# Patient Record
Sex: Female | Born: 1935 | Race: White | Hispanic: No | Smoking: Never smoker
Health system: Southern US, Community
[De-identification: ages and names within clinical notes are randomized; demographics above are authoritative.]

## PROBLEM LIST (undated history)

## (undated) DIAGNOSIS — I10 Essential (primary) hypertension: Secondary | ICD-10-CM

## (undated) HISTORY — PX: ABDOMINAL HYSTERECTOMY: SHX81

---

## 2005-06-10 ENCOUNTER — Ambulatory Visit: Payer: Self-pay

## 2006-09-01 ENCOUNTER — Ambulatory Visit: Payer: Self-pay

## 2007-02-12 ENCOUNTER — Emergency Department: Payer: Self-pay | Admitting: Emergency Medicine

## 2007-02-26 ENCOUNTER — Other Ambulatory Visit: Payer: Self-pay

## 2007-02-27 ENCOUNTER — Observation Stay: Payer: Self-pay | Admitting: Internal Medicine

## 2007-02-28 ENCOUNTER — Inpatient Hospital Stay: Payer: Self-pay | Admitting: Internal Medicine

## 2007-02-28 ENCOUNTER — Other Ambulatory Visit: Payer: Self-pay

## 2007-03-06 ENCOUNTER — Ambulatory Visit: Payer: Self-pay | Admitting: Internal Medicine

## 2007-04-13 ENCOUNTER — Ambulatory Visit: Payer: Self-pay | Admitting: Gastroenterology

## 2007-05-01 ENCOUNTER — Ambulatory Visit: Payer: Self-pay

## 2007-09-06 ENCOUNTER — Ambulatory Visit: Payer: Self-pay | Admitting: Internal Medicine

## 2007-12-29 ENCOUNTER — Observation Stay: Payer: Self-pay | Admitting: Internal Medicine

## 2007-12-29 ENCOUNTER — Other Ambulatory Visit: Payer: Self-pay

## 2008-01-06 ENCOUNTER — Ambulatory Visit: Payer: Self-pay | Admitting: Internal Medicine

## 2008-01-26 ENCOUNTER — Ambulatory Visit: Payer: Self-pay | Admitting: General Surgery

## 2008-09-20 ENCOUNTER — Emergency Department: Payer: Self-pay | Admitting: Emergency Medicine

## 2008-10-31 ENCOUNTER — Ambulatory Visit: Payer: Self-pay | Admitting: Internal Medicine

## 2009-03-18 ENCOUNTER — Emergency Department: Payer: Self-pay | Admitting: Emergency Medicine

## 2009-11-01 ENCOUNTER — Ambulatory Visit: Payer: Self-pay | Admitting: Internal Medicine

## 2010-03-03 ENCOUNTER — Observation Stay: Payer: Self-pay | Admitting: Internal Medicine

## 2010-08-26 ENCOUNTER — Emergency Department: Payer: Self-pay | Admitting: Emergency Medicine

## 2010-11-04 ENCOUNTER — Ambulatory Visit: Payer: Self-pay | Admitting: Internal Medicine

## 2010-11-15 ENCOUNTER — Observation Stay: Payer: Self-pay | Admitting: Internal Medicine

## 2010-12-07 ENCOUNTER — Inpatient Hospital Stay: Payer: Self-pay | Admitting: Internal Medicine

## 2011-11-13 IMAGING — CR DG CHEST 1V
1 series · 1 of 1 positions shown · non-contrast
Comparison: none

REASON FOR EXAM: SOB HTN
COMMENTS:   May transport without cardiac monitor

[view not recorded]
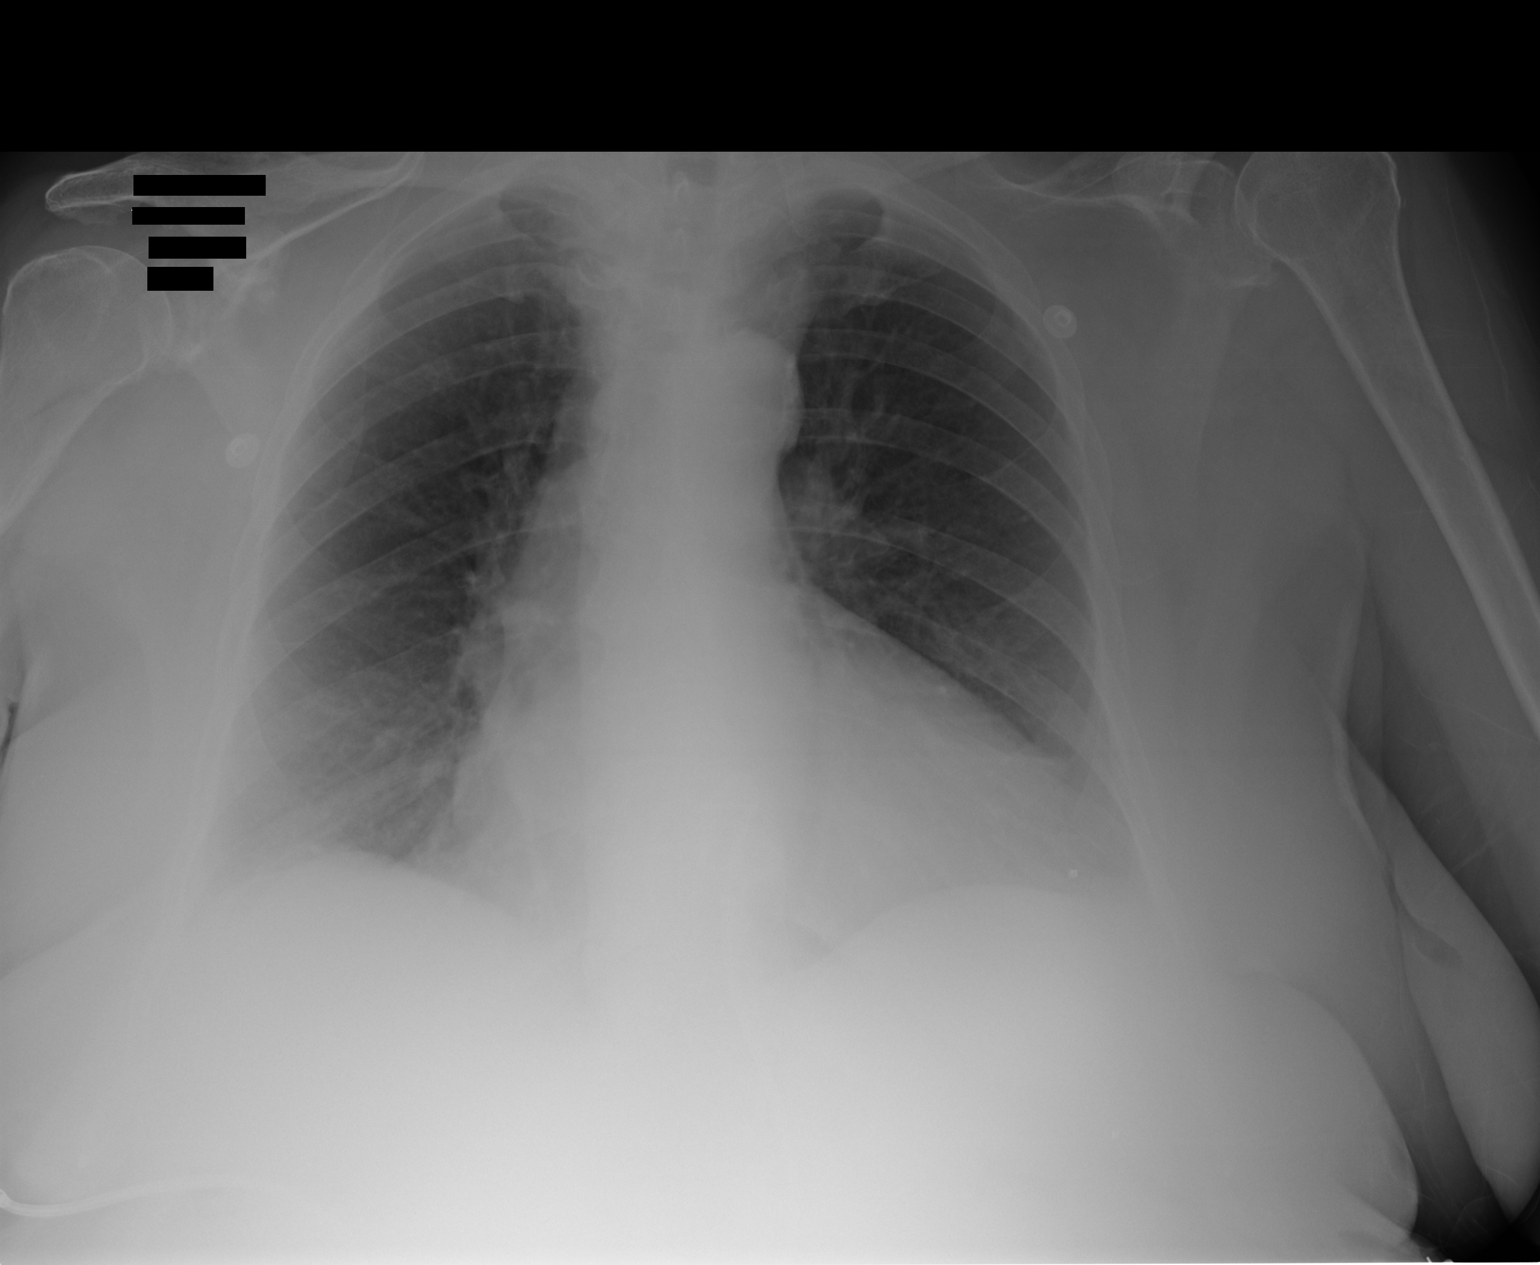

[1 of 1 positions shown; findings below may reference images not displayed]

PROCEDURE:     DXR - DXR CHEST 1 VIEWAP OR PA  - December 07, 2010 [DATE]

RESULT:     Comparison is made to the study 15 November, 2010.

The lungs are adequately inflated. There is no focal infiltrate. The cardiac
silhouette is enlarged. The pulmonary vascularity is not engorged. There is
no pleural effusion.
IMPRESSION: I do not see definite evidence of acute cardiopulmonary
abnormality. Chronic enlargement of the cardiac silhouette with mild
prominence of the pulmonary vasculature is noted.

## 2011-12-08 ENCOUNTER — Ambulatory Visit: Payer: Self-pay | Admitting: Internal Medicine

## 2012-11-03 ENCOUNTER — Ambulatory Visit: Payer: Self-pay | Admitting: Gastroenterology

## 2013-12-24 ENCOUNTER — Emergency Department: Payer: Self-pay | Admitting: Emergency Medicine

## 2013-12-24 LAB — URINALYSIS, COMPLETE
BACTERIA: NONE SEEN
Bilirubin,UR: NEGATIVE
Glucose,UR: NEGATIVE mg/dL (ref 0–75)
Ketone: NEGATIVE
Nitrite: NEGATIVE
PH: 6 (ref 4.5–8.0)
Protein: NEGATIVE
RBC,UR: <1 /HPF (ref 0–5)
Specific Gravity: 1.005 (ref 1.003–1.030)
Squamous Epithelial: 1
WBC UR: 7 /HPF (ref 0–5)

## 2014-03-24 ENCOUNTER — Emergency Department: Payer: Self-pay | Admitting: Emergency Medicine

## 2014-03-24 LAB — CBC WITH DIFFERENTIAL/PLATELET
BASOS PCT: 1.2 %
Basophil #: 0.1 10*3/uL (ref 0.0–0.1)
Eosinophil #: 0.2 10*3/uL (ref 0.0–0.7)
Eosinophil %: 2 %
HCT: 45.5 % (ref 35.0–47.0)
HGB: 15.2 g/dL (ref 12.0–16.0)
LYMPHS PCT: 15 %
Lymphocyte #: 1.3 10*3/uL (ref 1.0–3.6)
MCH: 31.1 pg (ref 26.0–34.0)
MCHC: 33.3 g/dL (ref 32.0–36.0)
MCV: 93 fL (ref 80–100)
MONOS PCT: 10.7 %
Monocyte #: 0.9 x10 3/mm (ref 0.2–0.9)
NEUTROS ABS: 6.2 10*3/uL (ref 1.4–6.5)
Neutrophil %: 71.1 %
Platelet: 225 10*3/uL (ref 150–440)
RBC: 4.88 10*6/uL (ref 3.80–5.20)
RDW: 13.7 % (ref 11.5–14.5)
WBC: 8.7 10*3/uL (ref 3.6–11.0)

## 2014-03-24 LAB — COMPREHENSIVE METABOLIC PANEL
ALBUMIN: 3.3 g/dL — AB (ref 3.4–5.0)
ALK PHOS: 68 U/L
ANION GAP: 7 (ref 7–16)
BUN: 14 mg/dL (ref 7–18)
Bilirubin,Total: 0.5 mg/dL (ref 0.2–1.0)
CREATININE: 1.2 mg/dL (ref 0.60–1.30)
Calcium, Total: 8.9 mg/dL (ref 8.5–10.1)
Chloride: 105 mmol/L (ref 98–107)
Co2: 30 mmol/L (ref 21–32)
EGFR (African American): 49 — ABNORMAL LOW
EGFR (Non-African Amer.): 42 — ABNORMAL LOW
GLUCOSE: 113 mg/dL — AB (ref 65–99)
OSMOLALITY: 284 (ref 275–301)
Potassium: 3.8 mmol/L (ref 3.5–5.1)
SGOT(AST): 16 U/L (ref 15–37)
SGPT (ALT): 19 U/L
Sodium: 142 mmol/L (ref 136–145)
Total Protein: 6.8 g/dL (ref 6.4–8.2)

## 2014-03-24 LAB — URINALYSIS, COMPLETE
BLOOD: NEGATIVE
Bacteria: NONE SEEN
Bilirubin,UR: NEGATIVE
Glucose,UR: NEGATIVE mg/dL (ref 0–75)
Hyaline Cast: 2
Ketone: NEGATIVE
Nitrite: NEGATIVE
Ph: 6 (ref 4.5–8.0)
Protein: NEGATIVE
RBC,UR: <1 /HPF (ref 0–5)
Specific Gravity: 1.008 (ref 1.003–1.030)
Squamous Epithelial: 1
WBC UR: 1 /HPF (ref 0–5)

## 2014-04-03 DIAGNOSIS — N183 Chronic kidney disease, stage 3 unspecified: Secondary | ICD-10-CM | POA: Diagnosis present

## 2014-04-03 DIAGNOSIS — G4733 Obstructive sleep apnea (adult) (pediatric): Secondary | ICD-10-CM | POA: Diagnosis present

## 2017-01-24 ENCOUNTER — Observation Stay
Admission: EM | Admit: 2017-01-24 | Discharge: 2017-01-25 | Disposition: A | Discharge status: Home / Self Care | Payer: Medicare Other | Attending: Internal Medicine | Admitting: Internal Medicine

## 2017-01-24 ENCOUNTER — Encounter: Payer: Self-pay | Admitting: Emergency Medicine

## 2017-01-24 ENCOUNTER — Emergency Department: Payer: Medicare Other

## 2017-01-24 DIAGNOSIS — Z7982 Long term (current) use of aspirin: Secondary | ICD-10-CM | POA: Diagnosis not present

## 2017-01-24 DIAGNOSIS — Z9889 Other specified postprocedural states: Secondary | ICD-10-CM | POA: Diagnosis not present

## 2017-01-24 DIAGNOSIS — Z6829 Body mass index (BMI) 29.0-29.9, adult: Secondary | ICD-10-CM | POA: Diagnosis not present

## 2017-01-24 DIAGNOSIS — G4733 Obstructive sleep apnea (adult) (pediatric): Secondary | ICD-10-CM | POA: Insufficient documentation

## 2017-01-24 DIAGNOSIS — R51 Headache: Principal | ICD-10-CM | POA: Insufficient documentation

## 2017-01-24 DIAGNOSIS — Z8249 Family history of ischemic heart disease and other diseases of the circulatory system: Secondary | ICD-10-CM | POA: Insufficient documentation

## 2017-01-24 DIAGNOSIS — I10 Essential (primary) hypertension: Secondary | ICD-10-CM | POA: Diagnosis not present

## 2017-01-24 DIAGNOSIS — Z79899 Other long term (current) drug therapy: Secondary | ICD-10-CM | POA: Insufficient documentation

## 2017-01-24 DIAGNOSIS — E669 Obesity, unspecified: Secondary | ICD-10-CM | POA: Diagnosis not present

## 2017-01-24 DIAGNOSIS — Z888 Allergy status to other drugs, medicaments and biological substances status: Secondary | ICD-10-CM | POA: Insufficient documentation

## 2017-01-24 DIAGNOSIS — Z90721 Acquired absence of ovaries, unilateral: Secondary | ICD-10-CM | POA: Insufficient documentation

## 2017-01-24 DIAGNOSIS — Z8042 Family history of malignant neoplasm of prostate: Secondary | ICD-10-CM | POA: Diagnosis not present

## 2017-01-24 DIAGNOSIS — R519 Headache, unspecified: Secondary | ICD-10-CM | POA: Diagnosis present

## 2017-01-24 DIAGNOSIS — E785 Hyperlipidemia, unspecified: Secondary | ICD-10-CM | POA: Insufficient documentation

## 2017-01-24 DIAGNOSIS — Z9049 Acquired absence of other specified parts of digestive tract: Secondary | ICD-10-CM | POA: Diagnosis not present

## 2017-01-24 DIAGNOSIS — Z806 Family history of leukemia: Secondary | ICD-10-CM | POA: Diagnosis not present

## 2017-01-24 DIAGNOSIS — Z90710 Acquired absence of both cervix and uterus: Secondary | ICD-10-CM | POA: Diagnosis not present

## 2017-01-24 HISTORY — DX: Essential (primary) hypertension: I10

## 2017-01-24 LAB — COMPREHENSIVE METABOLIC PANEL
ALK PHOS: 68 U/L (ref 38–126)
ALT: 15 U/L (ref 14–54)
AST: 22 U/L (ref 15–41)
Albumin: 3.8 g/dL (ref 3.5–5.0)
Anion gap: 7 (ref 5–15)
BUN: 16 mg/dL (ref 6–20)
CALCIUM: 9.1 mg/dL (ref 8.9–10.3)
CHLORIDE: 104 mmol/L (ref 101–111)
CO2: 30 mmol/L (ref 22–32)
CREATININE: 0.86 mg/dL (ref 0.44–1.00)
GFR calc Af Amer: >60 mL/min (ref >60)
Glucose, Bld: 110 mg/dL — ABNORMAL HIGH (ref 65–99)
Potassium: 3.8 mmol/L (ref 3.5–5.1)
Sodium: 141 mmol/L (ref 135–145)
Total Bilirubin: 0.6 mg/dL (ref 0.3–1.2)
Total Protein: 6.8 g/dL (ref 6.5–8.1)

## 2017-01-24 LAB — CBC
HEMATOCRIT: 42.2 % (ref 35.0–47.0)
HEMOGLOBIN: 14.6 g/dL (ref 12.0–16.0)
MCH: 31.7 pg (ref 26.0–34.0)
MCHC: 34.6 g/dL (ref 32.0–36.0)
MCV: 91.7 fL (ref 80.0–100.0)
PLATELETS: 250 10*3/uL (ref 150–440)
RBC: 4.6 MIL/uL (ref 3.80–5.20)
RDW: 13.4 % (ref 11.5–14.5)
WBC: 10.6 10*3/uL (ref 3.6–11.0)

## 2017-01-24 LAB — CSF CELL COUNT WITH DIFFERENTIAL
Eosinophils, CSF: 1 %
Eosinophils, CSF: 2 %
LYMPHS CSF: 10 %
LYMPHS CSF: 19 %
MONOCYTE-MACROPHAGE-SPINAL FLUID: 8 %
MONOCYTE-MACROPHAGE-SPINAL FLUID: 8 %
OTHER CELLS CSF: 0
OTHER CELLS CSF: 0
RBC COUNT CSF: 44042 /mm3 — AB (ref 0–3)
RBC COUNT CSF: 67241 /mm3 — AB (ref 0–3)
SEGMENTED NEUTROPHILS-CSF: 71 %
SEGMENTED NEUTROPHILS-CSF: 81 %
Tube #: 1
Tube #: 3
WBC, CSF: 129 /mm3 (ref 0–5)
WBC, CSF: 193 /mm3 (ref 0–5)

## 2017-01-24 LAB — URINALYSIS, COMPLETE (UACMP) WITH MICROSCOPIC
BILIRUBIN URINE: NEGATIVE
Bacteria, UA: NONE SEEN
GLUCOSE, UA: NEGATIVE mg/dL
Hgb urine dipstick: NEGATIVE
KETONES UR: NEGATIVE mg/dL
Leukocytes, UA: NEGATIVE
Nitrite: NEGATIVE
PROTEIN: NEGATIVE mg/dL
SQUAMOUS EPITHELIAL / LPF: NONE SEEN
Specific Gravity, Urine: 1.015 (ref 1.005–1.030)
pH: 6 (ref 5.0–8.0)

## 2017-01-24 LAB — PROTEIN AND GLUCOSE, CSF
GLUCOSE CSF: 70 mg/dL (ref 40–70)
TOTAL PROTEIN, CSF: 65 mg/dL — AB (ref 15–45)

## 2017-01-24 MED ORDER — DEXTROSE 5 % IV SOLN
2.0000 g | Freq: Once | INTRAVENOUS | Status: AC
  Administered 2017-01-24: 2 g via INTRAVENOUS
  Filled 2017-01-24: qty 2

## 2017-01-24 MED ORDER — DEXTROSE 5 % IV SOLN
INTRAVENOUS | Status: AC
  Filled 2017-01-24: qty 10

## 2017-01-24 NOTE — ED Notes (Signed)
md in to perform lumbar puncture with assist of juanetta, ed tech.

## 2017-01-24 NOTE — ED Notes (Signed)
md in to speak with family regarding lumbar puncture.

## 2017-01-24 NOTE — ED Triage Notes (Signed)
Pt report she woke up with a bad headache around 0800 states the headache comes and goes. Family with patient states about 30 minutes prior to arrival patient was complaining of headache that made her feel like she was going to pass out.  Pt reports intermittent vision problems this morning but states it is hard to explain what the problems were.  Pt denies any numbness or lightheadedness.  Grip strength equal, no facial droop no sensory changes.

## 2017-01-24 NOTE — ED Provider Notes (Signed)
[facility] Emergency Department Provider Note  ________________________________________   I have reviewed the triage vital signs and the nursing notes.   HISTORY  Chief Complaint Headache   History limited by: Not Limited   HPI Stacy Watts is a 83 y.o. female who presents to the emergency department today because of headache. It started this morning. She noticed it shortly after waking up. Tried taking some tylenol which did not significantly help the headache. The patient has not noticed any change in vision. States that she never has headaches. Daughter states that she visited the patient last week and she complained of feeling unwell and there has been some confusion. No fevers.   Past Medical History:  Diagnosis Date  . Hypertension     There are no active problems to display for this patient.   Past Surgical History:  Procedure Laterality Date  . ABDOMINAL HYSTERECTOMY      Prior to Admission medications   Medication Sig Start Date End Date Taking? Authorizing Provider  acetaminophen (TYLENOL) 500 MG tablet Take 500 mg by mouth every 6 (six) hours as needed.   Yes [provider]  aspirin EC 81 MG tablet Take 81 mg by mouth daily.   Yes [provider]  cholecalciferol (VITAMIN D) 1000 units tablet Take 1,000 Units by mouth daily.   Yes [provider]  hydrochlorothiazide (MICROZIDE) 12.5 MG capsule Take 12.5 mg by mouth daily.   Yes [provider]    Allergies Other  History reviewed. No pertinent family history.  Social History Social History  Substance Use Topics  . Smoking status: Never Smoker  . Smokeless tobacco: Never Used  . Alcohol use No    Review of Systems Constitutional: No fever/chills Eyes: No visual changes. ENT: No sore throat. Cardiovascular: Denies chest pain. Respiratory: Denies shortness of breath. Gastrointestinal: No abdominal pain.  No nausea, no vomiting.  No  diarrhea.   Genitourinary: Negative for dysuria. Musculoskeletal: Negative for back pain. Skin: Negative for rash. Neurological: Positive for headache.   ____________________________________________   PHYSICAL EXAM:  VITAL SIGNS: ED Triage Vitals  Enc Vitals Group     BP 01/24/17 1833 (!) 144/79     Pulse Rate 01/24/17 1833 78     Resp 01/24/17 1833 18     Temp 01/24/17 1833 98.3 F (36.8 C)     Temp Source 01/24/17 1833 Oral     SpO2 01/24/17 1833 94 %     Weight 01/24/17 1834 160 lb (72.6 kg)     Height 01/24/17 1834 4\' 10"  (1.473 m)    Constitutional: Alert and oriented. Well appearing and in no distress. Eyes: Conjunctivae are normal.  ENT   Head: Normocephalic and atraumatic.   Nose: No congestion/rhinnorhea.   Mouth/Throat: Mucous membranes are moist.   Neck: No stridor. No midline tenderness.  Hematological/Lymphatic/Immunilogical: No cervical lymphadenopathy. Cardiovascular: Normal rate, regular rhythm.  No murmurs, rubs, or gallops.  Respiratory: Normal respiratory effort without tachypnea nor retractions. Breath sounds are clear and equal bilaterally. No wheezes/rales/rhonchi. Gastrointestinal: Soft and non tender. No rebound. No guarding.  Genitourinary: Deferred Musculoskeletal: Normal range of motion in all extremities. No lower extremity edema. Neurologic:  Normal speech and language. No gross focal neurologic deficits are appreciated.  Skin:  Skin is warm, dry and intact. No rash noted. Psychiatric: Mood and affect are normal. Speech and behavior are normal. Patient exhibits appropriate insight and judgment.  ____________________________________________    LABS (pertinent positives/negatives)  Labs Reviewed  COMPREHENSIVE METABOLIC PANEL - Abnormal; Notable for the following:       Result Value   Glucose, Bld 110 (*)    All other components within normal limits  URINALYSIS, COMPLETE (UACMP) WITH MICROSCOPIC - Abnormal; Notable for the  following:    Color, Urine YELLOW (*)    APPearance CLEAR (*)    All other components within normal limits  CSF CELL COUNT WITH DIFFERENTIAL - Abnormal; Notable for the following:    Color, CSF RED (*)    Appearance, CSF CLOUDY (*)    RBC Count, CSF 44,042 (*)    WBC, CSF 129 (*)    All other components within normal limits  CSF CELL COUNT WITH DIFFERENTIAL - Abnormal; Notable for the following:    Color, CSF RED (*)    Appearance, CSF CLOUDY (*)    RBC Count, CSF 67,241 (*)    WBC, CSF 193 (*)    All other components within normal limits  PROTEIN AND GLUCOSE, CSF - Abnormal; Notable for the following:    Total  Protein, CSF 65 (*)    All other components within normal limits  BASIC METABOLIC PANEL - Abnormal; Notable for the following:    Glucose, Bld 114 (*)    Calcium 8.6 (*)    GFR calc non Af Amer 50 (*)    GFR calc Af Amer 58 (*)    All other components within normal limits  CSF CULTURE  CBC  CBC  SEDIMENTATION RATE  PATHOLOGIST SMEAR REVIEW  PATHOLOGIST SMEAR REVIEW  HERPES SIMPLEX VIRUS(HSV) DNA BY PCR     ____________________________________________   EKG  None  ____________________________________________    RADIOLOGY  CT head IMPRESSION: 1. No acute intracranial abnormalities. 2. Mild cerebral atrophy with mild chronic microvascular ischemic changes in the cerebral white matter, similar to the prior study.   ____________________________________________   PROCEDURES  Procedures  LUMBAR PUNCTURE  Date/Time: 01/24/2017 at 9:56 PM Performed by: Phineas Semen  Consent: Verbal consent obtained. Written consent obtained. Risks and benefits: risks, benefits and alternatives were discussed Consent given by: Patient and family Patient understanding: patient states understanding of the procedure being performed  Patient consent: the patient's understanding of the procedure matches consent given  Procedure consent: procedure consent matches  procedure scheduled  Relevant documents: relevant documents present and verified  Test results: test results available and properly labeled Site marked: the operative site was marked Imaging studies: imaging studies available  Required items: required blood products, implants, devices, and special equipment available  Patient identity confirmed: verbally with patient and arm band  Time out: Immediately prior to procedure a "time out" was called to verify the correct patient, procedure, equipment, support staff and site/side marked as required.  Indications: headache Anesthesia: local infiltration Local anesthetic: lidocaine 1% without epinephrine Anesthetic total: 2 ml Patient sedated: no Analgesia: none Preparation: Patient was prepped and draped in the usual sterile fashion. Lumbar space: L3-L4 interspace Patient's position: left lateral decubitus Needle gauge: 22 Needle length: 3.5 in Number of attempts: 1 Opening pressure: 14 cm H2O Fluid appearance: bloody Tubes of fluid: 4 Total volume: 6 ml Post-procedure: site cleaned and adhesive bandage applied Patient tolerance: Patient tolerated the procedure well with no immediate complications  ____________________________________________   INITIAL IMPRESSION / ASSESSMENT AND PLAN / ED COURSE  Pertinent labs & imaging results that were available during my care of the patient were reviewed by me and considered in my medical decision making (see chart for details).  Patient  presented with the emergency department today because of concern for headache. No history of headaches. Given new onset headache will proceed with lumbar puncture. Lumbar puncture grossly bloody. WBC elevated. At this point doubt bacterial meningitis given overall well appearance. Think possibly viral. Will however cover with antibiotics. Will admit to the hospitalist service.    ____________________________________________   FINAL CLINICAL IMPRESSION(S) / ED  DIAGNOSES  Final diagnoses:  Nonintractable headache, unspecified chronicity pattern, unspecified headache type     Note: This dictation was prepared with Dragon dictation. Any transcriptional errors that result from this process are unintentional     Phineas SemenGoodman, Shadana Pry, MD 01/25/17 1556

## 2017-01-24 NOTE — ED Notes (Signed)
Pt set up for lumbar puncture.  

## 2017-01-24 NOTE — ED Notes (Signed)
Pt tolerated lp well, pt supine currently. Pt states improved headache, "it's I don't know maybe a little, not too bad right now."

## 2017-01-24 NOTE — ED Notes (Signed)
Critical WBC in CSF called from lab. Dr. Derrill KayGoodman notified, first tube was 120 4th tube was 190. No new orders received.

## 2017-01-24 NOTE — ED Notes (Signed)
Pt complains of frontal headache, perrl 3mm and brisk. Pt denies known fever, nausea, vomiting. Pt without nuchal rigidity. Skin normal color warm and dry. Pt states headache began on awaking. Pt denies photophobia, sensitivity to sound.

## 2017-01-24 NOTE — ED Notes (Signed)
D/w Dr. Derrill KayGoodman pt's chief complaint. New orders received for labs, CT and EKG.

## 2017-01-24 NOTE — ED Notes (Signed)
Pt placed on droplet precautions pending csf testing. Family educated regarding precautions, pt educated regarding precautions. Pt continues to have no nuchal rigidity or rash.

## 2017-01-24 NOTE — H&P (Signed)
History and Physical   [facility] [place] [facility] Admission History and Physical [employer] Kaliana Albino, D.O.    Patient Name: Stacy ClockCatherine Watts MR#: [id number] Date of Birth: 06-24-1931 Date of Admission: 01/24/2017  Referring MD/NP/PA: Dr. Derrill KayGoodman Primary Care Physician: Stacy RegulusAnderson, Marshall W, MD   Chief Complaint:  Chief Complaint  Patient presents with  . Headache    HPI: Stacy Watts with a known history of HTN, Hyperlipidemia, obstructive sleep apnea presents to the emergency department for evaluation of headache.  Patient was in a usual state of health until Yesterday when she developed a frontal throbbing headache which did not improve with Tylenol. She denies any fevers, chills, vision disturbances. She denies any neck stiffness, back pain, sore throat or rash. She does have one sick contact. Lives with her brother who has been coughing for several days.  Patient denies fevers/chills, weakness, dizziness, chest pain, shortness of breath, N/V/C/D, abdominal pain, dysuria/frequency, changes in mental status.    Otherwise there has been no change in status. Patient has been taking medication as prescribed and there has been no recent change in medication or diet.  No recent antibiotics.  There has been no recent illness, hospitalizations, travel.    EMS/ED Course: Patient received Rocephin.  LP was performed and revealed numerous WBC and RBC. Medical admission was requested for intractable .  Review of Systems:  CONSTITUTIONAL: No fever/chills, fatigue, weakness, weight gain/loss. Positive headache. EYES: No blurry or double vision. ENT: No tinnitus, postnasal drip, redness or soreness of the oropharynx. RESPIRATORY: No cough, dyspnea, wheeze.  No hemoptysis.  CARDIOVASCULAR: No chest pain, palpitations, syncope, orthopnea. No lower extremity edema.  GASTROINTESTINAL: No nausea, vomiting, abdominal pain, diarrhea, constipation.  No hematemesis,  melena or hematochezia. GENITOURINARY: No dysuria, frequency, hematuria. ENDOCRINE: No polyuria or nocturia. No heat or cold intolerance. HEMATOLOGY: No anemia, bruising, bleeding. INTEGUMENTARY: No rashes, ulcers, lesions. MUSCULOSKELETAL: No arthritis, gout, dyspnea. NEUROLOGIC: No numbness, tingling, ataxia, seizure-type activity, weakness. PSYCHIATRIC: No anxiety, depression, insomnia.  Medical History   Medical History Date Comments  Hyperlipidemia, unspecified    Hypertension    Angioedema  with cozaar, June 2008, [facility]  Obesity, unspecified    Sleep apnea    Eczema, unspecified      Surgical History   Surgery Date Laterality Comments  HYSTERECTOMY 2005  with right oophorectomy  CHOLECYSTECTOMY 2009    COLONOSCOPY 04/13/2007, 11/03/2012    EGD 04/13/2007        reports that she has never smoked. She has never used smokeless tobacco. She reports that she does not drink alcohol. Her drug history is not on file.  Allergies  Allergen Reactions  . Other     UNKNOWN BLOOD PRESSURE MEDICATION    Family History   Medical History Relation Name Comments  Prostate cancer Brother    Myocardial Infarction (Heart attack) Father    Leukemia Sister       Prior to Admission medications   Medication Sig Start Date End Date Taking? Authorizing Provider  acetaminophen (TYLENOL) 500 MG tablet Take 500 mg by mouth every 6 (six) hours as needed.   Yes [provider]  aspirin EC 81 MG tablet Take 81 mg by mouth daily.   Yes [provider]  cholecalciferol (VITAMIN D) 1000 units tablet Take 1,000 Units by mouth daily.   Yes [provider]  hydrochlorothiazide (MICROZIDE) 12.5 MG capsule Take 12.5 mg by mouth daily.   Yes [provider]    Physical Exam: Vitals:   01/24/17 2154 01/24/17 2230 01/24/17 2330 01/24/17 2334  BP: (!) 144/76     Pulse: 76 66 71   Resp: 16     Temp:    97.8 F  (36.6 C)  TempSrc:    Oral  SpO2:  94% 96%   Weight:      Height:        GENERAL: 83 y.o.-year-old Watts patient, well-developed, well-nourished lying in the bed in no acute distress.  Pleasant and cooperative.   HEENT: Head atraumatic, normocephalic. Pupils equal, round, reactive to light and accommodation. No scleral icterus. Extraocular muscles intact. Nares are patent. Oropharynx is clear. Mucus membranes moist. NECK: Supple, full range of motion. No JVD, no bruit heard. No thyroid enlargement, no tenderness, no cervical lymphadenopathy. CHEST: Normal breath sounds bilaterally. No wheezing, rales, rhonchi or crackles. No use of accessory muscles of respiration.  No reproducible chest wall tenderness.  CARDIOVASCULAR: S1, S2 normal. No murmurs, rubs, or gallops. Cap refill <2 seconds. Pulses intact distally.  ABDOMEN: Soft, nondistended, nontender. No rebound, guarding, rigidity. Normoactive bowel sounds present in all four quadrants. No organomegaly or mass. EXTREMITIES: No pedal edema, cyanosis, or clubbing. No calf tenderness or Homan's sign.  NEUROLOGIC: The patient is alert and oriented x 3. Cranial nerves II through XII are grossly intact with no focal sensorimotor deficit. Muscle strength 5/5 in all extremities. Sensation intact. Gait not checked. PSYCHIATRIC:  Normal affect, mood, thought content. SKIN: Warm, dry, and intact without obvious rash, lesion, or ulcer.    Labs on Admission:  CBC:  Recent Labs Lab 01/24/17 1849  WBC 10.6  HGB 14.6  HCT 42.2  MCV 91.7  PLT 250   Basic Metabolic Panel:  Recent Labs Lab 01/24/17 1900  NA 141  K 3.8  CL 104  CO2 30  GLUCOSE 110*  BUN 16  CREATININE 0.86  CALCIUM 9.1   GFR: Estimated Creatinine Clearance: 41.2 mL/min (by C-G formula based on SCr of 0.86 mg/dL). Liver Function Tests:  Recent Labs Lab 01/24/17 1900  AST 22  ALT 15  ALKPHOS 68  BILITOT 0.6  PROT 6.8  ALBUMIN 3.8   No results for input(s):  LIPASE, AMYLASE in the last 168 hours. No results for input(s): AMMONIA in the last 168 hours. Coagulation Profile: No results for input(s): INR, PROTIME in the last 168 hours. Cardiac Enzymes: No results for input(s): CKTOTAL, CKMB, CKMBINDEX, TROPONINI in the last 168 hours. BNP (last 3 results) No results for input(s): PROBNP in the last 8760 hours. HbA1C: No results for input(s): HGBA1C in the last 72 hours. CBG: No results for input(s): GLUCAP in the last 168 hours. Lipid Profile: No results for input(s): CHOL, HDL, LDLCALC, TRIG, CHOLHDL, LDLDIRECT in the last 72 hours. Thyroid Function Tests: No results for input(s): TSH, T4TOTAL, FREET4, T3FREE, THYROIDAB in the last 72 hours. Anemia Panel: No results for input(s): VITAMINB12, FOLATE, FERRITIN, TIBC, IRON, RETICCTPCT in the last 72 hours. Urine analysis:    Component Value Date/Time   COLORURINE YELLOW (A) 01/24/2017 2007   APPEARANCEUR CLEAR (A) 01/24/2017 2007   APPEARANCEUR Clear 03/24/2014 1031   LABSPEC 1.015 01/24/2017 2007   LABSPEC 1.008 03/24/2014 1031   PHURINE 6.0 01/24/2017 2007   GLUCOSEU NEGATIVE 01/24/2017 2007   GLUCOSEU Negative 03/24/2014 1031   HGBUR NEGATIVE 01/24/2017 2007   BILIRUBINUR NEGATIVE 01/24/2017 2007   BILIRUBINUR Negative 03/24/2014 1031   KETONESUR NEGATIVE 01/24/2017 2007   PROTEINUR NEGATIVE  01/24/2017 2007   NITRITE NEGATIVE 01/24/2017 2007   LEUKOCYTESUR NEGATIVE 01/24/2017 2007   LEUKOCYTESUR Trace 03/24/2014 1031   Sepsis Labs: @LABRCNTIP (procalcitonin:4,lacticidven:4) ) Recent Results (from the past 240 hour(s))  CSF culture     Status: None (Preliminary result)   Collection Time: 01/24/17  9:45 PM  Result Value Ref Range Status   Specimen Description CSF  Final   Special Requests NONE  Final   Gram Stain   Final    NO ORGANISMS SEEN MANY RED BLOOD CELLS FEW WBC SEEN    Culture PENDING  Incomplete   Report Status PENDING  Incomplete     Radiological Exams on  Admission: Ct Head Wo Contrast  Result Date: 01/24/2017 CLINICAL DATA:  83 year old Watts with history of severe headaches since 8 a.m. today. Presyncopal sensations. Intermittent visual disturbances. EXAM: CT HEAD WITHOUT CONTRAST TECHNIQUE: Contiguous axial images were obtained from the base of the skull through the vertex without intravenous contrast. COMPARISON:  Head CT 12/24/2013. FINDINGS: Brain: Mild cerebral atrophy. Patchy and confluent areas of decreased attenuation are noted throughout the deep and periventricular white matter of the cerebral hemispheres bilaterally, compatible with chronic microvascular ischemic disease. Physiologic calcifications in the left basal ganglia again noted. No evidence of acute infarction, hemorrhage, hydrocephalus, extra-axial collection or mass lesion/mass effect. Vascular: No hyperdense vessel or unexpected calcification. Skull: Normal. Negative for fracture or focal lesion. Sinuses/Orbits: No acute finding. Other: None. IMPRESSION: 1. No acute intracranial abnormalities. 2. Mild cerebral atrophy with mild chronic microvascular ischemic changes in the cerebral white matter, similar to the prior study. Electronically Signed   By: Trudie Reed M.D.   On: 01/24/2017 19:31    EKG: Normal sinus rhythm at 79 bpm with normal axis, LVH, PVCs and nonspecific ST-T wave changes.   Assessment/Plan  This is a 83 y.o. Watts with a history of HTN, Hyperlipidemia, obstructive sleep apnea now being admitted with:  #. Intractable headache, rule out meningitis -Admit to observation -Continue IV Rocephin -Follow-up blood and CSF cultures -ID consult has been requested -Pain control  #. History of hypertension -Continue hydrochlorothiazide  Admission status: Observation IV Fluids: Normal saline Diet/Nutrition: Heart healthy Consults called: ID  DVT Px: Lovenox, SCDs and early ambulation. Code Status: Full Code  Disposition Plan: To home in 1-2 days  All the  records are reviewed and case discussed with ED provider. Management plans discussed with the patient and/or family who express understanding and agree with plan of care.  Stacy Watts D.O. on 01/24/2017 at 11:57 PM Between 7am to 6pm - Pager - [phone] After 6pm go to [email or web address] - [employer]ical [employer] [place] [facility] Office [phone] CC: Primary care physician; Stacy Regulus, MD   01/24/2017, 11:57 PM

## 2017-01-25 ENCOUNTER — Observation Stay: Payer: Medicare Other

## 2017-01-25 DIAGNOSIS — G441 Vascular headache, not elsewhere classified: Secondary | ICD-10-CM

## 2017-01-25 DIAGNOSIS — R51 Headache: Secondary | ICD-10-CM | POA: Diagnosis not present

## 2017-01-25 LAB — SEDIMENTATION RATE: SED RATE: 4 mm/h (ref 0–30)

## 2017-01-25 LAB — CBC
HCT: 40.4 % (ref 35.0–47.0)
Hemoglobin: 14 g/dL (ref 12.0–16.0)
MCH: 31.8 pg (ref 26.0–34.0)
MCHC: 34.7 g/dL (ref 32.0–36.0)
MCV: 91.8 fL (ref 80.0–100.0)
PLATELETS: 224 10*3/uL (ref 150–440)
RBC: 4.4 MIL/uL (ref 3.80–5.20)
RDW: 13.3 % (ref 11.5–14.5)
WBC: 9.3 10*3/uL (ref 3.6–11.0)

## 2017-01-25 LAB — BASIC METABOLIC PANEL
Anion gap: 7 (ref 5–15)
BUN: 14 mg/dL (ref 6–20)
CALCIUM: 8.6 mg/dL — AB (ref 8.9–10.3)
CO2: 31 mmol/L (ref 22–32)
Chloride: 104 mmol/L (ref 101–111)
Creatinine, Ser: 1 mg/dL (ref 0.44–1.00)
GFR calc Af Amer: 58 mL/min — ABNORMAL LOW (ref >60)
GFR, EST NON AFRICAN AMERICAN: 50 mL/min — AB (ref >60)
GLUCOSE: 114 mg/dL — AB (ref 65–99)
Potassium: 3.5 mmol/L (ref 3.5–5.1)
SODIUM: 142 mmol/L (ref 135–145)

## 2017-01-25 MED ORDER — ALBUTEROL SULFATE (2.5 MG/3ML) 0.083% IN NEBU: 2.5000 mg | INHALATION_SOLUTION | Freq: Four times a day (QID) | RESPIRATORY_TRACT | Status: DC | PRN

## 2017-01-25 MED ORDER — ONDANSETRON HCL 4 MG/2ML IJ SOLN: 4.0000 mg | Freq: Four times a day (QID) | INTRAMUSCULAR | Status: DC | PRN

## 2017-01-25 MED ORDER — VITAMIN D 1000 UNITS PO TABS
1000.0000 [IU] | ORAL_TABLET | Freq: Every day | ORAL | Status: DC
  Administered 2017-01-25: 11:00:00 1000 [IU] via ORAL
  Filled 2017-01-25: qty 1

## 2017-01-25 MED ORDER — GADOBENATE DIMEGLUMINE 529 MG/ML IV SOLN: 15.0000 mL | Freq: Once | INTRAVENOUS | Status: DC | PRN

## 2017-01-25 MED ORDER — KETOROLAC TROMETHAMINE 30 MG/ML IJ SOLN: 30.0000 mg | Freq: Three times a day (TID) | INTRAMUSCULAR | Status: DC | PRN

## 2017-01-25 MED ORDER — IPRATROPIUM BROMIDE 0.02 % IN SOLN: 0.5000 mg | Freq: Four times a day (QID) | RESPIRATORY_TRACT | Status: DC | PRN

## 2017-01-25 MED ORDER — ACETAMINOPHEN 650 MG RE SUPP: 650.0000 mg | Freq: Four times a day (QID) | RECTAL | Status: DC | PRN

## 2017-01-25 MED ORDER — HYDROCHLOROTHIAZIDE 12.5 MG PO CAPS
12.5000 mg | ORAL_CAPSULE | Freq: Every day | ORAL | Status: DC
  Administered 2017-01-25: 12.5 mg via ORAL
  Filled 2017-01-25: qty 1

## 2017-01-25 MED ORDER — ACETAMINOPHEN 325 MG PO TABS: 650.0000 mg | ORAL_TABLET | Freq: Four times a day (QID) | ORAL | Status: DC | PRN

## 2017-01-25 MED ORDER — ENOXAPARIN SODIUM 40 MG/0.4ML ~~LOC~~ SOLN
40.0000 mg | SUBCUTANEOUS | Status: DC
  Administered 2017-01-25: 40 mg via SUBCUTANEOUS
  Filled 2017-01-25: qty 0.4

## 2017-01-25 MED ORDER — IBUPROFEN 400 MG PO TABS: 400.0000 mg | ORAL_TABLET | Freq: Four times a day (QID) | ORAL | Status: DC | PRN

## 2017-01-25 MED ORDER — SODIUM CHLORIDE 0.9 % IV SOLN
INTRAVENOUS | Status: DC
  Administered 2017-01-25: 01:00:00 via INTRAVENOUS

## 2017-01-25 MED ORDER — BISACODYL 5 MG PO TBEC: 5.0000 mg | DELAYED_RELEASE_TABLET | Freq: Every day | ORAL | Status: DC | PRN

## 2017-01-25 MED ORDER — SENNOSIDES-DOCUSATE SODIUM 8.6-50 MG PO TABS: 1.0000 | ORAL_TABLET | Freq: Every evening | ORAL | Status: DC | PRN

## 2017-01-25 MED ORDER — MAGNESIUM CITRATE PO SOLN
1.0000 | Freq: Once | ORAL | Status: DC | PRN
  Filled 2017-01-25: qty 296

## 2017-01-25 MED ORDER — ONDANSETRON HCL 4 MG PO TABS: 4.0000 mg | ORAL_TABLET | Freq: Four times a day (QID) | ORAL | Status: DC | PRN

## 2017-01-25 MED ORDER — DEXTROSE 5 % IV SOLN
2.0000 g | Freq: Two times a day (BID) | INTRAVENOUS | Status: DC
  Administered 2017-01-25: 2 g via INTRAVENOUS
  Filled 2017-01-25 (×2): qty 2

## 2017-01-25 MED ORDER — ASPIRIN EC 81 MG PO TBEC
81.0000 mg | DELAYED_RELEASE_TABLET | Freq: Every day | ORAL | Status: DC
  Administered 2017-01-25: 81 mg via ORAL
  Filled 2017-01-25: qty 1

## 2017-01-25 MED ORDER — OXYCODONE HCL 5 MG PO TABS: 5.0000 mg | ORAL_TABLET | ORAL | Status: DC | PRN

## 2017-01-25 NOTE — Progress Notes (Signed)
Pharmacy Antibiotic Note  Stacy Watts is a 83 y.o. female admitted on 01/24/2017 with meningitis.  Pharmacy has been consulted for ceftriaxone dosing.  Plan: Patient received ceftriaxone 2g IV x 1 in ED  Will continue ceftriaxone 2g IV q12h for meningitis  Height: 5' (152.4 cm) Weight: 152 lb 2 oz (69 kg) IBW/kg (Calculated) : 45.5  Temp (24hrs), Avg:98.1 F (36.7 C), Min:97.8 F (36.6 C), Max:98.3 F (36.8 C)   Recent Labs Lab 01/24/17 1849 01/24/17 1900  WBC 10.6  --   CREATININE  --  0.86    Estimated Creatinine Clearance: 42.2 mL/min (by C-G formula based on SCr of 0.86 mg/dL).    Allergies  Allergen Reactions  . Other     UNKNOWN BLOOD PRESSURE MEDICATION    Thank you for allowing pharmacy to be a part of this patient's care.  Stacy RippleDavid  Bexton Watts 01/25/2017 1:39 AM

## 2017-01-25 NOTE — Progress Notes (Signed)
Chaplain received OR to visit patient in room 113 for Advanced Directive education. Chaplain met with patient and family- providing education. Patient requested to review and would page Chaplain when ready to complete.    01/25/17 0855  Clinical Encounter Type  Visited With Patient and family together  Visit Type Initial  Referral From Nurse  Consult/Referral To Chaplain  Spiritual Encounters  Spiritual Needs Other (Comment)  Advance Directives (For Healthcare)  Does Patient Have a Medical Advance Directive? No  Would patient like information on creating a medical advance directive? Yes (Inpatient - patient defers creating a medical advance directive at this time)

## 2017-01-25 NOTE — ED Notes (Signed)
Pt transport to 113 

## 2017-01-25 NOTE — Progress Notes (Signed)
PT Cancellation Note  Patient Details Name: Stacy Watts MRN: [id number] DOB: 1931/08/09   Cancelled Treatment:    Reason Eval/Treat Not Completed: Patient at procedure or test/unavailable.  Pt currently off floor for imaging.  Will re-attempt PT eval at a later date/time.  Hendricks LimesEmily Luva Metzger, PT 01/25/17, 1:43 PM [phone]

## 2017-01-25 NOTE — Care Management Obs Status (Signed)
MEDICARE OBSERVATION STATUS NOTIFICATION   Patient Details  Name: Caprice BeaverCatherine W Postlewait MRN: [id number] Date of Birth: 08-06-1931   Medicare Observation Status Notification Given:  No (Discharge order in less than 24 hours/ discharge cancelled)    Caren MacadamMichelle Veida Spira, RN 01/25/2017, 2:46 PM

## 2017-01-25 NOTE — Progress Notes (Signed)
Pt did not complete MRI of the neck, it appears she became anxious during exam and asked to stop the exam. Per the radiology technician Thereasa Distance(Rodney), he is willing to try to finish the MRI of the neck if pt is willing. When speaking to her she does not seem to want to try the test again. Dr. Elpidio AnisSudini paged, he stated he will speak to the neurologist to determine if pt can be discharged without the completed MRI of the neck.

## 2017-01-25 NOTE — Discharge Instructions (Addendum)
Resume diet and activity as before   Please return if any fever, worsening headache, stroke like symptoms  Use Tylenol as needed for headache

## 2017-01-25 NOTE — Care Management Obs Status (Signed)
MEDICARE OBSERVATION STATUS NOTIFICATION   Patient Details  Name: Caprice BeaverCatherine W Ware MRN: [id number] Date of Birth: Dec 23, 1930   Medicare Observation Status Notification Given:  No (No Moon letter given, discharged in less than 24 hours)    Lucilla Petrenko A, RN 01/25/2017, 3:37 PM

## 2017-01-25 NOTE — Progress Notes (Signed)
Pt's daughter came out to desk and stated that the patient is willing to finish the MRI test of her neck if she can "have something to relax". Malka, RN is taking over care of pt now. She has been updated and will update Dr. Elpidio AnisSudini once he calls back after speaking to the neurologist.

## 2017-01-25 NOTE — Progress Notes (Signed)
Pt is being discharged home. Discharge papers given and explained to pt and daughters, all verbalized understanding. Meds and f/u appointment reviewed with pt. No RX at this time.

## 2017-01-25 NOTE — Progress Notes (Signed)
Pt arrived to [facility] 113 from ED with family present. Admission assessment and profile completed. Pt and family oriented to room, plan of care and informed to call for assistance when needed. Pt is now resting quietly in room with call bell in reach and family at bedside.

## 2017-01-25 NOTE — Consult Note (Signed)
Reason for Consult:Headache Referring Physician: Sudini  CC: Headache  HPI: Stacy Watts is an 83 y.o. female who presented to the ED on yesterday with complaints of headache.  Patient reports that on the day prior to admission she developed a frontal headache.  Headache was sharp but not associated with visual changes,numbness, weakness, nausea, vomiting or photophobia.  Headache was not relieved with OTC medications and persisted so patient presented to the ED for further evaluation.  Headache resolved in the ED.    Past Medical History:  Diagnosis Date  . Hypertension     Past Surgical History:  Procedure Laterality Date  . ABDOMINAL HYSTERECTOMY      Family history: Father with CAD, deceased from MI.  Brother with prostate cancer.  Sister with leukemia.    Social History:  reports that she has never smoked. She has never used smokeless tobacco. She reports that she does not drink alcohol. Her drug history is not on file.  Allergies  Allergen Reactions  . Other     UNKNOWN BLOOD PRESSURE MEDICATION    Medications:  I have reviewed the patient's current medications. Prior to Admission:  Prescriptions Prior to Admission  Medication Sig Dispense Refill Last Dose  . acetaminophen (TYLENOL) 500 MG tablet Take 500 mg by mouth every 6 (six) hours as needed.   01/24/2017 at prn  . aspirin EC 81 MG tablet Take 81 mg by mouth daily.   01/24/2017 at am  . cholecalciferol (VITAMIN D) 1000 units tablet Take 1,000 Units by mouth daily.   01/24/2017 at am  . hydrochlorothiazide (MICROZIDE) 12.5 MG capsule Take 12.5 mg by mouth daily.   01/24/2017 at am   Scheduled: . aspirin EC  81 mg Oral Daily  . cholecalciferol  1,000 Units Oral Daily  . enoxaparin (LOVENOX) injection  40 mg Subcutaneous Q24H  . hydrochlorothiazide  12.5 mg Oral Daily    ROS: History obtained from the patient  General ROS: negative for - chills, fatigue, fever, night sweats, weight gain or weight  loss Psychological ROS: negative for - behavioral disorder, hallucinations, memory difficulties, mood swings or suicidal ideation Ophthalmic ROS: negative for - blurry vision, double vision, eye pain or loss of vision ENT ROS: HOH Allergy and Immunology ROS: negative for - hives or itchy/watery eyes Hematological and Lymphatic ROS: negative for - bleeding problems, bruising or swollen lymph nodes Endocrine ROS: negative for - galactorrhea, hair pattern changes, polydipsia/polyuria or temperature intolerance Respiratory ROS: negative for - cough, hemoptysis, shortness of breath or wheezing Cardiovascular ROS: negative for - chest pain, dyspnea on exertion, edema or irregular heartbeat Gastrointestinal ROS: negative for - abdominal pain, diarrhea, hematemesis, nausea/vomiting or stool incontinence Genito-Urinary ROS: negative for - dysuria, hematuria, incontinence or urinary frequency/urgency Musculoskeletal ROS: negative for - joint swelling or muscular weakness Neurological ROS: as noted in HPI Dermatological ROS: negative for rash and skin lesion changes  Physical Examination: Blood pressure 132/64, pulse 65, temperature 98 F (36.7 C), temperature source Oral, resp. rate 16, height 5' (1.524 m), weight 69 kg (152 lb 2 oz), SpO2 94 %.  HEENT-  Normocephalic, no lesions, without obvious abnormality.  Normal external eye and conjunctiva.  Normal TM's bilaterally.  Normal auditory canals and external ears. Normal external nose, mucus membranes and septum.  Normal pharynx. Cardiovascular- S1, S2 normal, pulses palpable throughout   Lungs- chest clear, no wheezing, rales, normal symmetric air entry Abdomen- soft, non-tender; bowel sounds normal; no masses,  no organomegaly Extremities- no edema Lymph-no  adenopathy palpable Musculoskeletal-no joint tenderness, deformity or swelling Skin-warm and dry, no hyperpigmentation, vitiligo, or suspicious lesions  Neurological Examination   Mental  Status: Alert, oriented, thought content appropriate.  Speech fluent without evidence of aphasia.  Able to follow 3 step commands without difficulty. Cranial Nerves: II: Discs flat bilaterally; Visual fields grossly normal, pupils equal, round, reactive to light and accommodation III,IV, VI: ptosis not present, extra-ocular motions intact bilaterally V,VII: smile symmetric, facial light touch sensation normal bilaterally VIII: hearing decreased bilaterally IX,X: gag reflex present XI: bilateral shoulder shrug XII: midline tongue extension Motor: Right : Upper extremity   5/5    Left:     Upper extremity   5/5  Lower extremity   5/5     Lower extremity   5/5 Tone and bulk:normal tone throughout; no atrophy noted Sensory: Pinprick and light touch intact throughout, bilaterally Deep Tendon Reflexes: 2+ and symmetric with absent AJ's bilaterally Plantars: Right: mute   Left: mute Cerebellar: Normal finger-to-nose and normal heel-to-shin testing bilaterally Gait: not tested due to safety concerns    Laboratory Studies:   Basic Metabolic Panel:  Recent Labs Lab 01/24/17 1900 01/25/17 0604  NA 141 142  K 3.8 3.5  CL 104 104  CO2 30 31  GLUCOSE 110* 114*  BUN 16 14  CREATININE 0.86 1.00  CALCIUM 9.1 8.6*    Liver Function Tests:  Recent Labs Lab 01/24/17 1900  AST 22  ALT 15  ALKPHOS 68  BILITOT 0.6  PROT 6.8  ALBUMIN 3.8   No results for input(s): LIPASE, AMYLASE in the last 168 hours. No results for input(s): AMMONIA in the last 168 hours.  CBC:  Recent Labs Lab 01/24/17 1849 01/25/17 0604  WBC 10.6 9.3  HGB 14.6 14.0  HCT 42.2 40.4  MCV 91.7 91.8  PLT 250 224    Cardiac Enzymes: No results for input(s): CKTOTAL, CKMB, CKMBINDEX, TROPONINI in the last 168 hours.  BNP: Invalid input(s): POCBNP  CBG: No results for input(s): GLUCAP in the last 168 hours.  Microbiology: Results for orders placed or performed during the hospital encounter of  01/24/17  CSF culture     Status: None (Preliminary result)   Collection Time: 01/24/17  9:45 PM  Result Value Ref Range Status   Specimen Description CSF  Final   Special Requests NONE  Final   Gram Stain   Final    NO ORGANISMS SEEN MANY RED BLOOD CELLS FEW WBC SEEN    Culture PENDING  Incomplete   Report Status PENDING  Incomplete    Coagulation Studies: No results for input(s): LABPROT, INR in the last 72 hours.  Urinalysis:  Recent Labs Lab 01/24/17 2007  COLORURINE YELLOW*  LABSPEC 1.015  PHURINE 6.0  GLUCOSEU NEGATIVE  HGBUR NEGATIVE  BILIRUBINUR NEGATIVE  KETONESUR NEGATIVE  PROTEINUR NEGATIVE  NITRITE NEGATIVE  LEUKOCYTESUR NEGATIVE    Lipid Panel:  No results found for: CHOL, TRIG, HDL, CHOLHDL, VLDL, LDLCALC  HgbA1C: No results found for: HGBA1C  Urine Drug Screen:  No results found for: LABOPIA, COCAINSCRNUR, LABBENZ, AMPHETMU, THCU, LABBARB  Alcohol Level: No results for input(s): ETH in the last 168 hours.  Other results: EKG: sinus rhythm at 79 bpm with occasional PVC's.  Imaging: Ct Head Wo Contrast  Result Date: 01/24/2017 CLINICAL DATA:  83 year old female with history of severe headaches since 8 a.m. today. Presyncopal sensations. Intermittent visual disturbances. EXAM: CT HEAD WITHOUT CONTRAST TECHNIQUE: Contiguous axial images were obtained from the base of the  skull through the vertex without intravenous contrast. COMPARISON:  Head CT 12/24/2013. FINDINGS: Brain: Mild cerebral atrophy. Patchy and confluent areas of decreased attenuation are noted throughout the deep and periventricular white matter of the cerebral hemispheres bilaterally, compatible with chronic microvascular ischemic disease. Physiologic calcifications in the left basal ganglia again noted. No evidence of acute infarction, hemorrhage, hydrocephalus, extra-axial collection or mass lesion/mass effect. Vascular: No hyperdense vessel or unexpected calcification. Skull: Normal.  Negative for fracture or focal lesion. Sinuses/Orbits: No acute finding. Other: None. IMPRESSION: 1. No acute intracranial abnormalities. 2. Mild cerebral atrophy with mild chronic microvascular ischemic changes in the cerebral white matter, similar to the prior study. Electronically Signed   By: Vinnie Langton M.D.   On: 01/24/2017 19:31     Assessment/Plan: 83 year old female presenting with headache.  No associated neurological complaints.  LP performed in the ED showed elevated white and red blood cells.  Protein mildly elevated.  White cells out pf proportion to what would be expected.  Red cells increasing with second tube.  Patient without fever and white blood cell count.   Culture negative to date with no organisms seen in gram stain.  Bacterial meningitis unlikely.  Vial meningitis remains on the differential.  Will also rule out aneurysm.    Recommendations: 1.  MRI, MRA of the brain 2.  ESR 3.  If above unremarkable, patient may follow up on an outpatient basis.  Case discussed with Dr. Sharia Reeve, MD Neurology [phone] 01/25/2017, 11:59 AM

## 2017-01-25 NOTE — Care Management Obs Status (Signed)
MEDICARE OBSERVATION STATUS NOTIFICATION   Patient Details  Name: Stacy Watts MRN: [id number] Date of Birth: [birth year]   Medicare Observation Status Notification Given:  No (Discharge order in less than 24 hours)    Caren MacadamMichelle Ademide Schaberg, RN 01/25/2017, 11:20 AM

## 2017-01-25 NOTE — Plan of Care (Signed)
Problem: Education: Goal: Knowledge of [facility] Education information/materials will improve Outcome: Progressing Plan of care and unit/room orientation discussed with pt and family with verbal acknowledgment of understanding.   Problem: Safety: Goal: Ability to remain free from injury will improve Outcome: Progressing Pt and family educated to call for assistance when needed. Pt family plans to stay with pt around the clock.   Problem: Pain Managment: Goal: General experience of comfort will improve Outcome: Progressing Pt has no complaints of pain at this time.   Problem: Skin Integrity: Goal: Risk for impaired skin integrity will decrease Outcome: Progressing No skin issues/breakdown noted  Problem: Nutrition: Goal: Adequate nutrition will be maintained Outcome: Progressing Pts family is providing pt with diner since it is post cafeteria hours.

## 2017-01-26 LAB — PATHOLOGIST SMEAR REVIEW

## 2017-01-27 LAB — HERPES SIMPLEX VIRUS(HSV) DNA BY PCR
HSV 1 DNA: NEGATIVE
HSV 2 DNA: NEGATIVE

## 2017-01-28 LAB — CSF CULTURE W GRAM STAIN
Culture: NO GROWTH
Gram Stain: NONE SEEN

## 2017-01-29 NOTE — Discharge Summary (Signed)
[facility] [place] [facility]   PATIENT NAME: Stacy Watts    MR#:  [id number]  DATE OF BIRTH:  [birth year]  DATE OF ADMISSION:  01/24/2017 ADMITTING PHYSICIAN: Tonye Royalty, DO  DATE OF DISCHARGE: 01/25/2017  4:13 PM  PRIMARY CARE PHYSICIAN: Lauro Regulus, MD   ADMISSION DIAGNOSIS:  Nonintractable headache, unspecified chronicity pattern, unspecified headache type [R51]  DISCHARGE DIAGNOSIS:  Active Problems:   Intractable headache   SECONDARY DIAGNOSIS:   Past Medical History:  Diagnosis Date  . Hypertension      ADMITTING HISTORY  HPI: Stacy Watts is a 83 y.o. female with a known history of HTN, Hyperlipidemia, obstructive sleep apnea presents to the emergency department for evaluation of headache.  Patient was in a usual state of health until Yesterday when she developed a frontal throbbing headache which did not improve with Tylenol. She denies any fevers, chills, vision disturbances. She denies any neck stiffness, back pain, sore throat or rash. She does have one sick contact. Lives with her brother who has been coughing for several days.  Patient denies fevers/chills, weakness, dizziness, chest pain, shortness of breath, N/V/C/D, abdominal pain, dysuria/frequency, changes in mental status.    Otherwise there has been no change in status. Patient has been taking medication as prescribed and there has been no recent change in medication or diet.  No recent antibiotics.  There has been no recent illness, hospitalizations, travel.    EMS/ED Course: Patient received Rocephin.  LP was performed and revealed numerous WBC and RBC. Medical admission was requested for intractable .  HOSPITAL COURSE:   * Headache. This was likely tension headache. Treated with NSAIDs. Improved well. LP was done which was a traumatic tap. Elevated RBC and WBC. CSF cultures remain negative. MRI of the brain along with MRA was checked to rule out  subarachnoid bleed or aneurysms. This was negative. Discussed with Dr. Thad Ranger of neurology who saw the patient. All WBC. Patient is being discharged home in stable condition. She can use Tylenol or ibuprofen as needed for any further headaches.  Stable for discharge home.  CONSULTS OBTAINED:  Treatment Team:  Thana Farr, MD  DRUG ALLERGIES:   Allergies  Allergen Reactions  . Other     UNKNOWN BLOOD PRESSURE MEDICATION    DISCHARGE MEDICATIONS:   Discharge Medication List as of 01/25/2017  3:50 PM    CONTINUE these medications which have NOT CHANGED   Details  acetaminophen (TYLENOL) 500 MG tablet Take 500 mg by mouth every 6 (six) hours as needed., Historical Med    aspirin EC 81 MG tablet Take 81 mg by mouth daily., Historical Med    cholecalciferol (VITAMIN D) 1000 units tablet Take 1,000 Units by mouth daily., Historical Med    hydrochlorothiazide (MICROZIDE) 12.5 MG capsule Take 12.5 mg by mouth daily., Historical Med        Today   VITAL SIGNS:  Blood pressure (!) 153/63, pulse 76, temperature 97.8 F (36.6 C), temperature source Oral, resp. rate 18, height 5' (1.524 m), weight 69 kg (152 lb 2 oz), SpO2 93 %.  I/O:  No intake or output data in the 24 hours ending 01/29/17 1132  PHYSICAL EXAMINATION:  Physical Exam  GENERAL:  83 y.o.-year-old patient lying in the bed with no acute distress.  LUNGS: Normal breath sounds bilaterally, no wheezing, rales,rhonchi or crepitation. No use of accessory muscles of respiration.  CARDIOVASCULAR: S1, S2 normal. No murmurs, rubs, or gallops.  ABDOMEN: Soft, non-tender,  non-distended. Bowel sounds present. No organomegaly or mass.  NEUROLOGIC: Moves all 4 extremities. PSYCHIATRIC: The patient is alert and oriented x 3.  SKIN: No obvious rash, lesion, or ulcer.   DATA REVIEW:   CBC  Recent Labs Lab 01/25/17 0604  WBC 9.3  HGB 14.0  HCT 40.4  PLT 224    Chemistries   Recent Labs Lab 01/24/17 1900  01/25/17 0604  NA 141 142  K 3.8 3.5  CL 104 104  CO2 30 31  GLUCOSE 110* 114*  BUN 16 14  CREATININE 0.86 1.00  CALCIUM 9.1 8.6*  AST 22  --   ALT 15  --   ALKPHOS 68  --   BILITOT 0.6  --     Cardiac Enzymes No results for input(s): TROPONINI in the last 168 hours.  Microbiology Results  Results for orders placed or performed during the hospital encounter of 01/24/17  CSF culture     Status: None   Collection Time: 01/24/17  9:45 PM  Result Value Ref Range Status   Specimen Description CSF  Final   Special Requests NONE  Final   Gram Stain   Final    NO ORGANISMS SEEN MANY RED BLOOD CELLS FEW WBC SEEN    Culture   Final    NO GROWTH 3 DAYS Performed at [facility], [address] [place], [place] [address]    Report Status 01/28/2017 FINAL  Final    RADIOLOGY:  No results found.  Follow up with PCP in 1 week.  Management plans discussed with the patient, family and they are in agreement.  CODE STATUS:  Code Status History    Date Active Date Inactive Code Status Order ID Comments User Context   01/25/2017 12:59 AM 01/25/2017  7:16 PM Full Code [id number]  Hugelmeyer, Jon GillsAlexis, DO Inpatient      TOTAL TIME TAKING CARE OF THIS PATIENT ON DAY OF DISCHARGE: more than 30 minutes.   Milagros LollSudini, Kamaiya Antilla R M.D on 01/29/2017 at 11:32 AM  Between 7am to 6pm - Pager - [phone]  After 6pm go to [email or web address] - password [email or web address]  [facility]  Office  [phone]  CC: Primary care physician; Lauro RegulusAnderson, Marshall W, MD  Note: This dictation was prepared with Dragon dictation along with smaller phrase technology. Any transcriptional errors that result from this process are unintentional.

## 2021-09-23 ENCOUNTER — Emergency Department: Payer: Medicare Other

## 2021-09-23 ENCOUNTER — Encounter: Payer: Self-pay | Admitting: Internal Medicine

## 2021-09-23 ENCOUNTER — Inpatient Hospital Stay
Admission: EM | Admit: 2021-09-23 | Discharge: 2021-10-03 | DRG: 853 | Disposition: A | Discharge status: Hospice | Payer: Medicare Other | Attending: Student in an Organized Health Care Education/Training Program | Admitting: Student in an Organized Health Care Education/Training Program

## 2021-09-23 ENCOUNTER — Other Ambulatory Visit: Payer: Self-pay

## 2021-09-23 DIAGNOSIS — Z66 Do not resuscitate: Secondary | ICD-10-CM | POA: Diagnosis not present

## 2021-09-23 DIAGNOSIS — I4729 Other ventricular tachycardia: Secondary | ICD-10-CM | POA: Clinically undetermined

## 2021-09-23 DIAGNOSIS — Z9049 Acquired absence of other specified parts of digestive tract: Secondary | ICD-10-CM

## 2021-09-23 DIAGNOSIS — R338 Other retention of urine: Secondary | ICD-10-CM | POA: Diagnosis present

## 2021-09-23 DIAGNOSIS — K623 Rectal prolapse: Secondary | ICD-10-CM | POA: Diagnosis present

## 2021-09-23 DIAGNOSIS — L304 Erythema intertrigo: Secondary | ICD-10-CM | POA: Diagnosis present

## 2021-09-23 DIAGNOSIS — I10 Essential (primary) hypertension: Secondary | ICD-10-CM | POA: Diagnosis present

## 2021-09-23 DIAGNOSIS — R339 Retention of urine, unspecified: Secondary | ICD-10-CM

## 2021-09-23 DIAGNOSIS — E669 Obesity, unspecified: Secondary | ICD-10-CM | POA: Diagnosis present

## 2021-09-23 DIAGNOSIS — N390 Urinary tract infection, site not specified: Secondary | ICD-10-CM | POA: Diagnosis not present

## 2021-09-23 DIAGNOSIS — N993 Prolapse of vaginal vault after hysterectomy: Secondary | ICD-10-CM | POA: Diagnosis present

## 2021-09-23 DIAGNOSIS — M549 Dorsalgia, unspecified: Secondary | ICD-10-CM

## 2021-09-23 DIAGNOSIS — R7303 Prediabetes: Secondary | ICD-10-CM | POA: Diagnosis present

## 2021-09-23 DIAGNOSIS — I472 Ventricular tachycardia, unspecified: Secondary | ICD-10-CM | POA: Diagnosis not present

## 2021-09-23 DIAGNOSIS — G9341 Metabolic encephalopathy: Secondary | ICD-10-CM | POA: Diagnosis present

## 2021-09-23 DIAGNOSIS — R6520 Severe sepsis without septic shock: Secondary | ICD-10-CM | POA: Diagnosis present

## 2021-09-23 DIAGNOSIS — E870 Hyperosmolality and hypernatremia: Secondary | ICD-10-CM | POA: Diagnosis not present

## 2021-09-23 DIAGNOSIS — M4646 Discitis, unspecified, lumbar region: Secondary | ICD-10-CM | POA: Diagnosis present

## 2021-09-23 DIAGNOSIS — N136 Pyonephrosis: Secondary | ICD-10-CM | POA: Diagnosis present

## 2021-09-23 DIAGNOSIS — Z888 Allergy status to other drugs, medicaments and biological substances status: Secondary | ICD-10-CM

## 2021-09-23 DIAGNOSIS — E872 Acidosis, unspecified: Secondary | ICD-10-CM | POA: Diagnosis present

## 2021-09-23 DIAGNOSIS — G8929 Other chronic pain: Secondary | ICD-10-CM | POA: Diagnosis present

## 2021-09-23 DIAGNOSIS — Z7982 Long term (current) use of aspirin: Secondary | ICD-10-CM

## 2021-09-23 DIAGNOSIS — Z7189 Other specified counseling: Secondary | ICD-10-CM | POA: Diagnosis not present

## 2021-09-23 DIAGNOSIS — G061 Intraspinal abscess and granuloma: Secondary | ICD-10-CM | POA: Diagnosis present

## 2021-09-23 DIAGNOSIS — Z515 Encounter for palliative care: Secondary | ICD-10-CM

## 2021-09-23 DIAGNOSIS — E876 Hypokalemia: Secondary | ICD-10-CM | POA: Diagnosis not present

## 2021-09-23 DIAGNOSIS — K6812 Psoas muscle abscess: Secondary | ICD-10-CM | POA: Diagnosis present

## 2021-09-23 DIAGNOSIS — A419 Sepsis, unspecified organism: Principal | ICD-10-CM

## 2021-09-23 DIAGNOSIS — L8990 Pressure ulcer of unspecified site, unspecified stage: Secondary | ICD-10-CM | POA: Insufficient documentation

## 2021-09-23 DIAGNOSIS — H9193 Unspecified hearing loss, bilateral: Secondary | ICD-10-CM | POA: Diagnosis present

## 2021-09-23 DIAGNOSIS — N1831 Chronic kidney disease, stage 3a: Secondary | ICD-10-CM | POA: Diagnosis present

## 2021-09-23 DIAGNOSIS — Z90710 Acquired absence of both cervix and uterus: Secondary | ICD-10-CM

## 2021-09-23 DIAGNOSIS — L89153 Pressure ulcer of sacral region, stage 3: Secondary | ICD-10-CM | POA: Diagnosis present

## 2021-09-23 DIAGNOSIS — Z881 Allergy status to other antibiotic agents status: Secondary | ICD-10-CM

## 2021-09-23 DIAGNOSIS — B372 Candidiasis of skin and nail: Secondary | ICD-10-CM | POA: Diagnosis present

## 2021-09-23 DIAGNOSIS — E871 Hypo-osmolality and hyponatremia: Secondary | ICD-10-CM | POA: Diagnosis present

## 2021-09-23 DIAGNOSIS — R1310 Dysphagia, unspecified: Secondary | ICD-10-CM | POA: Diagnosis not present

## 2021-09-23 DIAGNOSIS — I129 Hypertensive chronic kidney disease with stage 1 through stage 4 chronic kidney disease, or unspecified chronic kidney disease: Secondary | ICD-10-CM | POA: Diagnosis present

## 2021-09-23 DIAGNOSIS — M4626 Osteomyelitis of vertebra, lumbar region: Secondary | ICD-10-CM | POA: Diagnosis present

## 2021-09-23 DIAGNOSIS — N1330 Unspecified hydronephrosis: Secondary | ICD-10-CM | POA: Diagnosis present

## 2021-09-23 DIAGNOSIS — Z79899 Other long term (current) drug therapy: Secondary | ICD-10-CM

## 2021-09-23 DIAGNOSIS — F0394 Unspecified dementia, unspecified severity, with anxiety: Secondary | ICD-10-CM | POA: Diagnosis present

## 2021-09-23 DIAGNOSIS — M4620 Osteomyelitis of vertebra, site unspecified: Secondary | ICD-10-CM | POA: Diagnosis not present

## 2021-09-23 DIAGNOSIS — N183 Chronic kidney disease, stage 3 unspecified: Secondary | ICD-10-CM | POA: Diagnosis present

## 2021-09-23 DIAGNOSIS — Z20822 Contact with and (suspected) exposure to covid-19: Secondary | ICD-10-CM | POA: Diagnosis present

## 2021-09-23 DIAGNOSIS — M419 Scoliosis, unspecified: Secondary | ICD-10-CM | POA: Diagnosis present

## 2021-09-23 DIAGNOSIS — Z6831 Body mass index (BMI) 31.0-31.9, adult: Secondary | ICD-10-CM

## 2021-09-23 DIAGNOSIS — G4733 Obstructive sleep apnea (adult) (pediatric): Secondary | ICD-10-CM | POA: Diagnosis present

## 2021-09-23 DIAGNOSIS — N179 Acute kidney failure, unspecified: Secondary | ICD-10-CM

## 2021-09-23 DIAGNOSIS — R509 Fever, unspecified: Secondary | ICD-10-CM

## 2021-09-23 LAB — URINALYSIS, ROUTINE W REFLEX MICROSCOPIC
Bilirubin Urine: NEGATIVE
Glucose, UA: NEGATIVE mg/dL
Ketones, ur: NEGATIVE mg/dL
Nitrite: NEGATIVE
Protein, ur: NEGATIVE mg/dL
Specific Gravity, Urine: <1.005 — ABNORMAL LOW (ref 1.005–1.030)
Squamous Epithelial / HPF: NONE SEEN (ref 0–5)
pH: 5.5 (ref 5.0–8.0)

## 2021-09-23 LAB — CBC WITH DIFFERENTIAL/PLATELET
Abs Immature Granulocytes: 2.75 10*3/uL — ABNORMAL HIGH (ref 0.00–0.07)
Basophils Absolute: 0.1 10*3/uL (ref 0.0–0.1)
Basophils Relative: 0 %
Eosinophils Absolute: 0.2 10*3/uL (ref 0.0–0.5)
Eosinophils Relative: 1 %
HCT: 39.7 % (ref 36.0–46.0)
Hemoglobin: 13.1 g/dL (ref 12.0–15.0)
Immature Granulocytes: 9 %
Lymphocytes Relative: 5 %
Lymphs Abs: 1.5 10*3/uL (ref 0.7–4.0)
MCH: 30 pg (ref 26.0–34.0)
MCHC: 33 g/dL (ref 30.0–36.0)
MCV: 91.1 fL (ref 80.0–100.0)
Monocytes Absolute: 2.4 10*3/uL — ABNORMAL HIGH (ref 0.1–1.0)
Monocytes Relative: 8 %
Neutro Abs: 23.7 10*3/uL — ABNORMAL HIGH (ref 1.7–7.7)
Neutrophils Relative %: 77 %
Platelets: 215 10*3/uL (ref 150–400)
RBC: 4.36 MIL/uL (ref 3.87–5.11)
RDW: 14.8 % (ref 11.5–15.5)
Smear Review: NORMAL
WBC: 31 10*3/uL — ABNORMAL HIGH (ref 4.0–10.5)
nRBC: 0 % (ref 0.0–0.2)

## 2021-09-23 LAB — COMPREHENSIVE METABOLIC PANEL
ALT: 20 U/L (ref 0–44)
AST: 23 U/L (ref 15–41)
Albumin: 2.8 g/dL — ABNORMAL LOW (ref 3.5–5.0)
Alkaline Phosphatase: 170 U/L — ABNORMAL HIGH (ref 38–126)
Anion gap: 14 (ref 5–15)
BUN: 97 mg/dL — ABNORMAL HIGH (ref 8–23)
CO2: 22 mmol/L (ref 22–32)
Calcium: 8.1 mg/dL — ABNORMAL LOW (ref 8.9–10.3)
Chloride: 93 mmol/L — ABNORMAL LOW (ref 98–111)
Creatinine, Ser: 3.45 mg/dL — ABNORMAL HIGH (ref 0.44–1.00)
GFR, Estimated: 12 mL/min — ABNORMAL LOW (ref >60)
Glucose, Bld: 92 mg/dL (ref 70–99)
Potassium: 4.3 mmol/L (ref 3.5–5.1)
Sodium: 129 mmol/L — ABNORMAL LOW (ref 135–145)
Total Bilirubin: 0.4 mg/dL (ref 0.3–1.2)
Total Protein: 6.8 g/dL (ref 6.5–8.1)

## 2021-09-23 LAB — RESP PANEL BY RT-PCR (FLU A&B, COVID) ARPGX2
Influenza A by PCR: NEGATIVE
Influenza B by PCR: NEGATIVE
SARS Coronavirus 2 by RT PCR: NEGATIVE

## 2021-09-23 LAB — LACTIC ACID, PLASMA
Lactic Acid, Venous: 1.4 mmol/L (ref 0.5–1.9)
Lactic Acid, Venous: 2.3 mmol/L (ref 0.5–1.9)

## 2021-09-23 LAB — BRAIN NATRIURETIC PEPTIDE: B Natriuretic Peptide: 174.8 pg/mL — ABNORMAL HIGH (ref 0.0–100.0)

## 2021-09-23 LAB — LIPASE, BLOOD: Lipase: 47 U/L (ref 11–51)

## 2021-09-23 MED ORDER — HEPARIN SODIUM (PORCINE) 5000 UNIT/ML IJ SOLN
5000.0000 [IU] | Freq: Three times a day (TID) | INTRAMUSCULAR | Status: DC
  Administered 2021-09-23 – 2021-09-30 (×20): 5000 [IU] via SUBCUTANEOUS
  Filled 2021-09-23 (×20): qty 1

## 2021-09-23 MED ORDER — HYDRALAZINE HCL 20 MG/ML IJ SOLN
10.0000 mg | Freq: Once | INTRAMUSCULAR | Status: DC
  Filled 2021-09-23: qty 1

## 2021-09-23 MED ORDER — FENTANYL CITRATE PF 50 MCG/ML IJ SOSY
50.0000 ug | PREFILLED_SYRINGE | Freq: Once | INTRAMUSCULAR | Status: AC
Start: 2021-09-23 — End: 2021-09-23
  Administered 2021-09-23: 50 ug via INTRAVENOUS
  Filled 2021-09-23: qty 1

## 2021-09-23 MED ORDER — VANCOMYCIN VARIABLE DOSE PER UNSTABLE RENAL FUNCTION (PHARMACIST DOSING): Status: DC

## 2021-09-23 MED ORDER — SODIUM CHLORIDE 0.9 % IV SOLN
2.0000 g | Freq: Once | INTRAVENOUS | Status: AC
  Administered 2021-09-23: 2 g via INTRAVENOUS
  Filled 2021-09-23: qty 2

## 2021-09-23 MED ORDER — MORPHINE SULFATE (PF) 2 MG/ML IV SOLN
1.0000 mg | INTRAVENOUS | Status: AC | PRN
  Administered 2021-09-23 – 2021-09-24 (×2): 1 mg via INTRAVENOUS
  Filled 2021-09-23 (×2): qty 1

## 2021-09-23 MED ORDER — LIDOCAINE 5 % EX PTCH
1.0000 | MEDICATED_PATCH | CUTANEOUS | Status: DC
  Administered 2021-09-23 – 2021-10-01 (×9): 1 via TRANSDERMAL
  Filled 2021-09-23 (×9): qty 1

## 2021-09-23 MED ORDER — SODIUM CHLORIDE 0.9 % IV SOLN
1.0000 g | INTRAVENOUS | Status: DC
  Administered 2021-09-24: 1 g via INTRAVENOUS
  Filled 2021-09-23 (×2): qty 1

## 2021-09-23 MED ORDER — VANCOMYCIN HCL IN DEXTROSE 1-5 GM/200ML-% IV SOLN: 1000.0000 mg | Freq: Once | INTRAVENOUS | Status: DC

## 2021-09-23 MED ORDER — FENTANYL CITRATE PF 50 MCG/ML IJ SOSY
50.0000 ug | PREFILLED_SYRINGE | Freq: Once | INTRAMUSCULAR | Status: AC
  Administered 2021-09-23: 50 ug via INTRAVENOUS
  Filled 2021-09-23: qty 1

## 2021-09-23 MED ORDER — METRONIDAZOLE 500 MG/100ML IV SOLN
500.0000 mg | Freq: Two times a day (BID) | INTRAVENOUS | Status: DC
  Administered 2021-09-24: 500 mg via INTRAVENOUS
  Filled 2021-09-23 (×2): qty 100

## 2021-09-23 MED ORDER — ONDANSETRON HCL 4 MG/2ML IJ SOLN
4.0000 mg | Freq: Once | INTRAMUSCULAR | Status: AC
  Administered 2021-09-23: 4 mg via INTRAVENOUS
  Filled 2021-09-23: qty 2

## 2021-09-23 MED ORDER — METRONIDAZOLE 500 MG/100ML IV SOLN
500.0000 mg | Freq: Once | INTRAVENOUS | Status: AC
  Administered 2021-09-23: 500 mg via INTRAVENOUS
  Filled 2021-09-23: qty 100

## 2021-09-23 MED ORDER — SODIUM CHLORIDE 0.9 % IV SOLN: INTRAVENOUS | Status: AC

## 2021-09-23 MED ORDER — VANCOMYCIN HCL 1500 MG/300ML IV SOLN
1500.0000 mg | Freq: Once | INTRAVENOUS | Status: AC
  Administered 2021-09-23: 1500 mg via INTRAVENOUS
  Filled 2021-09-23 (×2): qty 300

## 2021-09-23 NOTE — ED Provider Notes (Signed)
[facility] Provider Note    Event Date/Time   First MD Initiated Contact with Patient 09/23/21 1641     (approximate)   History   UTI and Back Pain   HPI  Stacy Watts is a 88 y.o. female with hypertension, CKD, prediabetes who comes in for UTI, back pain.  Patient reports that she has been taking the UTI treatment and her UTI symptoms have gotten better however yesterday she started having back pain.  She reports the back pain is in the middle lower back.  Denies any thoracic upper back pain, chest pain, shortness of breath.  The back pain is severe and worse when she tries to move her legs.  Denies any falls, hitting her head.  She does have some swelling in her legs which she reports is baseline for her.  I noted that the right leg was slightly larger than the left and she states its always been like that.  She states that she is not here for her legs that she is here because of the back pain.  I reviewed patient's primary care doctor note from 05/13/2021 at [facility] where patient is on torsemide 40 mg once daily as well as potassium pills.  I also reviewed a telemedicine note from 2/1 where patient had symptoms of UTI with home test positive x2 based on prior urine cultures patient was started on Bactrim twice daily for 3 days.  Physical Exam   Triage Vital Signs: ED Triage Vitals  Enc Vitals Group     BP 09/23/21 1637 (!) 147/42     Pulse Rate 09/23/21 1637 100     Resp 09/23/21 1637 16     Temp 09/23/21 1637 97.9 F (36.6 C)     Temp Source 09/23/21 1637 Oral     SpO2 09/23/21 1637 94 %     Weight 09/23/21 1638 160 lb (72.6 kg)     Height 09/23/21 1636 4\' 11"  (1.499 m)     Head Circumference --      Peak Flow --      Pain Score 09/23/21 1636 5     Pain Loc --      Pain Edu? --      Excl. in [place]? --     Most recent vital signs: Vitals:   09/23/21 1637  BP: (!) 147/42  Pulse: 100  Resp: 16  Temp: 97.9 F (36.6 C)  SpO2: 94%      General: Awake, no distress.  Elderly female CV:  Good peripheral perfusion.  Resp:  Normal effort.  Abd:  No distention.  Tender in her left lower quadrant Other:  Patient with lower mid back pain.  Pain with trying to lift up either leg but still able to move her legs Swelling noted in the bilateral legs   ED Results / Procedures / Treatments   Labs (all labs ordered are listed, but only abnormal results are displayed) Labs Reviewed  RESP PANEL BY RT-PCR (FLU A&B, COVID) [id number]  URINE CULTURE  CBC WITH DIFFERENTIAL/PLATELET  COMPREHENSIVE METABOLIC PANEL  LIPASE, BLOOD  BRAIN NATRIURETIC PEPTIDE  URINALYSIS, ROUTINE W REFLEX MICROSCOPIC     RADIOLOGY I have reviewed the CT personally and agree with radiology read patient has distended bladder with concern for hydronephrosis.  Chest x-ray reviewed as well without any evidence of edema   PROCEDURES:  Critical Care performed: No  .Critical Care Performed by: Vanessa [place], MD Authorized by: Vanessa Clayton, MD  Critical care provider statement:    Critical care time (minutes):  30   Critical care was necessary to treat or prevent imminent or life-threatening deterioration of the following conditions:  Sepsis   Critical care was time spent personally by me on the following activities:  Development of treatment plan with patient or surrogate, discussions with consultants, evaluation of patient's response to treatment, examination of patient, ordering and review of laboratory studies, ordering and review of radiographic studies, ordering and performing treatments and interventions, pulse oximetry, re-evaluation of patient's condition and review of old [place] ED: Medications  lidocaine (LIDODERM) 5 % 1 patch (1 patch Transdermal Patch Applied 09/23/21 1711)  ceFEPIme (MAXIPIME) 2 g in sodium chloride 0.9 % 100 mL IVPB (2 g Intravenous New Bag/Given 09/23/21 2016)  metroNIDAZOLE (FLAGYL) IVPB 500  mg (500 mg Intravenous New Bag/Given 09/23/21 2013)  vancomycin (VANCOREADY) IVPB 1500 mg/300 mL (has no administration in time range)  metroNIDAZOLE (FLAGYL) IVPB 500 mg (has no administration in time range)  heparin injection 5,000 Units (has no administration in time range)  0.9 %  sodium chloride infusion (has no administration in time range)  hydrALAZINE (APRESOLINE) injection 10 mg (has no administration in time range)  fentaNYL (SUBLIMAZE) injection 50 mcg (50 mcg Intravenous Given 09/23/21 1711)  ondansetron (ZOFRAN) injection 4 mg (4 mg Intravenous Given 09/23/21 1710)  fentaNYL (SUBLIMAZE) injection 50 mcg (50 mcg Intravenous Given 09/23/21 1840)     IMPRESSION / MDM / [place] / ED COURSE  I reviewed the triage vital signs and the nursing notes.                             Patient with hypertension, CKD who comes in for back pain Differential diagnosis includes, but is not limited to, diverticulitis, AAA rupture, appendicitis kidney stone, pyelonephritis.  Will get CT scan to further evaluate and give some IV fentanyl and lidocaine patch to help with pain.  Given the leg swelling I will also add on a BNP and chest x-ray just to make sure there is no evidence of fluid in her lungs.  When I asked if she is ever had a blood clot in her leg she denies states that her legs always look like this and states has not why she is here today.   White count is significantly elevated at 31,000.  Unclear source.  Patient recently treated for UTI.  Will cover broadly get blood cultures. COVID, flu are negative CMP shows significantly elevated creatinine at 3.45 and low sodium Lipase normal  CT scan concerning for hydronephrosis secondary to distended bladder.  Will place Foley.  Suspect the urinary retention is causing AKI.  Discussed with nurse draining it slowly and clamping it after 1 L and then do the rest a little bit to prevent hypotension  Given the above we will discuss with the  hospital team for admission  The patient is on the cardiac monitor to evaluate for evidence of arrhythmia and/or significant heart rate changes.   FINAL CLINICAL IMPRESSION(S) / ED DIAGNOSES   Final diagnoses:  AKI (acute kidney injury) ([place])  Urinary retention  Sepsis, due to unspecified organism, unspecified whether acute organ dysfunction present [facility])     Rx / DC Orders   ED Discharge Orders     None        Note:  This document was prepared using Dragon voice recognition software and  may include unintentional dictation errors.   Vanessa Crugers, MD 09/23/21 2023

## 2021-09-23 NOTE — Progress Notes (Signed)
PHARMACY -  BRIEF ANTIBIOTIC NOTE   Pharmacy has received consult(s) for vancomycin and cefepime from an ED provider.  The patient's profile has been reviewed for ht/wt/allergies/indication/available labs.    One time order(s) placed for   1) cefepime 2 grams IV x 1  2) vancomycin 1500 mg IV x 1  Further antibiotics/pharmacy consults should be ordered by admitting physician if indicated.                       Thank you, Lowella Bandy 09/23/2021  7:27 PM

## 2021-09-23 NOTE — ED Notes (Signed)
Report received from Mara, RN 

## 2021-09-23 NOTE — Assessment & Plan Note (Signed)
Due to pelvic floor laxity causing vaginal prolapse. Foley.

## 2021-09-23 NOTE — ED Triage Notes (Addendum)
Pt comes with c/o back pain and dx with UTI four days ago. Pt states she started on some meds and it got worse.  VSS  Pt is A&OX4. Pt also has severe swelling noted to all extremities. Pt states that is not new.

## 2021-09-23 NOTE — H&P (Signed)
History and Physical    Patient: Stacy Watts SWN:[id number] DOB: [birth year] DOA: 09/23/2021 DOS: the patient was seen and examined on 09/23/2021 PCP: Kirk Ruths, MD  Patient coming from: Home  Chief Complaint:  Chief Complaint  Patient presents with   UTI   Back Pain    HPI: Stacy Watts is a 88 y.o. female seen in the hospital today with complaints of back pain.  Patient has already been started on treatment for urinary tract infection however her symptoms including her back pain which is in the middle of the lower back is gotten worse patient denies any neurological symptoms incontinence or tingling.  Patient was started on Bactrim.  HPI is limited secondary to patient's dementia.  Review of Systems: Review of Systems  Constitutional:  Negative for malaise/fatigue.  Genitourinary:  Positive for dysuria.  Musculoskeletal:  Positive for back pain.   Past Medical History:  Diagnosis Date   Hypertension    Past Surgical History:  Procedure Laterality Date   ABDOMINAL HYSTERECTOMY     Social History:  reports that she has never smoked. She has never used smokeless tobacco. She reports that she does not drink alcohol. No history on file for drug use.  Allergies  Allergen Reactions   Amlodipine     Other reaction(s): Dizziness   Cefdinir     Other reaction(s): Unknown   Ciprofloxacin     Other reaction(s): Unknown   Losartan     Other reaction(s): Angioedema   Nitrofurantoin Swelling   Other     UNKNOWN BLOOD PRESSURE MEDICATION   Carvedilol Rash   Clonidine Rash and Swelling    History reviewed. No pertinent family history.  Prior to Admission medications   Medication Sig Start Date End Date Taking? Authorizing Provider  acetaminophen (TYLENOL) 500 MG tablet Take 500 mg by mouth every 6 (six) hours as needed.    [provider]  aspirin EC 81 MG tablet Take 81 mg by mouth daily.    [provider]  cholecalciferol (VITAMIN D)  1000 units tablet Take 1,000 Units by mouth daily.    [provider]  hydrochlorothiazide (MICROZIDE) 12.5 MG capsule Take 12.5 mg by mouth daily.    [provider]  potassium chloride (KLOR-CON) 10 MEQ tablet Take 10 mEq by mouth daily. 07/10/21   [provider]  torsemide (DEMADEX) 20 MG tablet Take 40 mg by mouth daily. 09/16/21   [provider]    Physical Exam: Vitals:   09/23/21 1930 09/23/21 2000 09/23/21 2030 09/23/21 2126  BP: 126/62 (!) 124/45 (!) 143/78 (!) 142/57  Pulse: 86 84 88 92  Resp: 20 (!) 21 17 (!) 22  Temp:  97.9 F (36.6 C)  97.7 F (36.5 C)  TempSrc:  Oral  Oral  SpO2: 99% 94% 93% 92%  Weight:      Height:      Physical Exam Vitals reviewed.  Constitutional:      Appearance: She is obese.  HENT:     Head: Normocephalic and atraumatic.     Right Ear: External ear normal.     Left Ear: External ear normal.     Nose: Nose normal.     Mouth/Throat:     Mouth: Mucous membranes are moist.  Eyes:     Extraocular Movements: Extraocular movements intact.     Pupils: Pupils are equal, round, and reactive to light.  Neck:     Vascular: No carotid bruit.  Cardiovascular:  Rate and Rhythm: Normal rate and regular rhythm.     Pulses: Normal pulses.     Heart sounds: Normal heart sounds.  Pulmonary:     Effort: Pulmonary effort is normal.     Breath sounds: Rales present.  Abdominal:     General: Bowel sounds are normal. There is no distension.     Palpations: Abdomen is soft. There is no mass.     Tenderness: There is no abdominal tenderness. There is no guarding.     Hernia: No hernia is present.  Musculoskeletal:     Right lower leg: No edema.     Left lower leg: No edema.  Skin:    General: Skin is warm.  Neurological:     General: No focal deficit present.     Mental Status: She is alert and oriented to person, place, and time.  Psychiatric:        Mood and Affect: Mood normal.        Behavior: Behavior  normal.    Data Reviewed: > CMP shows sodium of 129 chloride 93, BUN 97 creatinine 3.4, alk phos of 170, albumin 2.8 otherwise LFTs within normal limits. > BNP of 174.8, lactate of 1.4. > CBC shows a white count of 31, 215.   Assessment and Plan: * Sepsis secondary to UTI [facility])- (present on admission) attribute to uti. Wbc count of 31.  We will cont empiric broad spectrum iv abx follow culture and sensitivity.   UTI (urinary tract infection)- (present on admission) Due to anatomical urinary retention. We will place foley and cont iv abx and follow c/s with cefepime and metronidazole.  gentle ivf hydration.     AKI (acute kidney injury) ([place])- (present on admission) Due to vaginal prolapse and pt will need urogyn to prevent future hydronephrosis and complications.  Lab Results  Component Value Date   CREATININE 3.45 (H) 09/23/2021   CREATININE 1.00 01/25/2017   CREATININE 0.86 01/24/2017  we will hold renally cleared meds and renally dose them .   Bilateral hydronephrosis- (present on admission) Due to pelvic floor laxity causing vaginal prolapse. Foley.    OSA (obstructive sleep apnea)- (present on admission) cpap per home settings.   HTN (hypertension)- (present on admission) Blood pressure (!) 136/57, pulse 98, temperature 97.9 F (36.6 C), temperature source Oral, resp. rate 16, height 4' 11"  (1.499 m), weight 72.6 kg, SpO2 94 %. we will hold torsemide and hctz.  Prn hydralazine. For BP t/t.   Hyponatremia- (present on admission) Cont with aggressive ivf hydration.    Advance Care Planning:   Code Status: Full Code   Consults: none   Family Communication: RICE,CONNIE H (Daughter)  [phone] (Mobile)  Severity of Illness: The appropriate patient status for this patient is INPATIENT. Inpatient status is judged to be reasonable and necessary in order to provide the required intensity of service to ensure the patient's safety. The patient's presenting  symptoms, physical exam findings, and initial radiographic and laboratory data in the context of their chronic comorbidities is felt to place them at high risk for further clinical deterioration. Furthermore, it is not anticipated that the patient will be medically stable for discharge from the hospital within 2 midnights of admission.   * I certify that at the point of admission it is my clinical judgment that the patient will require inpatient hospital care spanning beyond 2 midnights from the point of admission due to high intensity of service, high risk for further deterioration and high frequency of  surveillance required.*  Author: Para Skeans, MD 09/23/2021 11:57 PM  For on call review [email or web address].

## 2021-09-23 NOTE — Assessment & Plan Note (Signed)
cpap per home settings.  

## 2021-09-23 NOTE — Assessment & Plan Note (Signed)
Due to vaginal prolapse and pt will need urogyn to prevent future hydronephrosis and complications.  Lab Results  Component Value Date   CREATININE 3.45 (H) 09/23/2021   CREATININE 1.00 01/25/2017   CREATININE 0.86 01/24/2017  we will hold renally cleared meds and renally dose them .

## 2021-09-23 NOTE — Assessment & Plan Note (Signed)
Blood pressure (!) 136/57, pulse 98, temperature 97.9 F (36.6 C), temperature source Oral, resp. rate 16, height 4\' 11"  (1.499 m), weight 72.6 kg, SpO2 94 %. we will hold torsemide and hctz.  Prn hydralazine. For BP t/t.

## 2021-09-23 NOTE — Assessment & Plan Note (Signed)
attribute to uti. Wbc count of 31.  We will cont empiric broad spectrum iv abx follow culture and sensitivity.

## 2021-09-23 NOTE — Assessment & Plan Note (Signed)
Cont with aggressive ivf hydration.

## 2021-09-23 NOTE — Progress Notes (Signed)
Pharmacy Antibiotic Note  Stacy Watts is a 88 y.o. female admitted on 09/23/2021 with back pain. Patient took Bactrim PTA for UTI with initial improvement in symptoms until 2/5 when she started having back pain. Pharmacy has been consulted for cefepime and vancomycin dosing. Patient presented with AKI, creatinine 3.45 on admission. Baseline appears to be around 1.0 based on labs in recent months.  Plan: Vancomycin 1500 mg IV x 1 ordered. Will check renal function with morning labs and assess further vancomycin dosing as appropriate.  Cefepime 1 g q24h based on current renal function  Height: 4\' 11"  (149.9 cm) Weight: 72.6 kg (160 lb) IBW/kg (Calculated) : 43.2  Temp (24hrs), Avg:97.9 F (36.6 C), Min:97.9 F (36.6 C), Max:97.9 F (36.6 C)  Recent Labs  Lab 09/23/21 1640 09/23/21 1836  WBC 31.0*  --   CREATININE 3.45*  --   LATICACIDVEN  --  1.4    Estimated Creatinine Clearance: 9.8 mL/min (A) (by C-G formula based on SCr of 3.45 mg/dL (H)).    Allergies  Allergen Reactions   Amlodipine     Other reaction(s): Dizziness   Cefdinir     Other reaction(s): Unknown   Ciprofloxacin     Other reaction(s): Unknown   Losartan     Other reaction(s): Angioedema   Nitrofurantoin Swelling   Other     UNKNOWN BLOOD PRESSURE MEDICATION   Carvedilol Rash   Clonidine Rash and Swelling    Antimicrobials this admission: Cefepime 2/6 >> Vancomycin 2/6 >>  Microbiology results: 2/6 BCx: pending 2/6 UCx: pending    Thank you for allowing pharmacy to be a part of this patients care.  11/21/21, PharmD, BCPS Clinical Pharmacist 09/23/2021 8:59 PM

## 2021-09-23 NOTE — ED Notes (Signed)
Pillows placed on pt's back while on the recliner.

## 2021-09-23 NOTE — ED Notes (Signed)
Hospitalist at the bedside 

## 2021-09-23 NOTE — ED Notes (Signed)
Unable to get urine at this time, pt sitting on a recliner refusing to be moved to bed d/t severe back pain.

## 2021-09-23 NOTE — Assessment & Plan Note (Signed)
Due to anatomical urinary retention. We will place foley and cont iv abx and follow c/s with cefepime and metronidazole.  gentle ivf hydration.

## 2021-09-24 ENCOUNTER — Encounter: Payer: Self-pay | Admitting: Internal Medicine

## 2021-09-24 DIAGNOSIS — N1330 Unspecified hydronephrosis: Secondary | ICD-10-CM

## 2021-09-24 DIAGNOSIS — E871 Hypo-osmolality and hyponatremia: Secondary | ICD-10-CM | POA: Diagnosis not present

## 2021-09-24 DIAGNOSIS — N179 Acute kidney failure, unspecified: Secondary | ICD-10-CM

## 2021-09-24 DIAGNOSIS — A419 Sepsis, unspecified organism: Secondary | ICD-10-CM | POA: Diagnosis not present

## 2021-09-24 LAB — CREATININE, SERUM
Creatinine, Ser: 2.98 mg/dL — ABNORMAL HIGH (ref 0.44–1.00)
GFR, Estimated: 15 mL/min — ABNORMAL LOW (ref >60)

## 2021-09-24 MED ORDER — HYDROMORPHONE HCL 1 MG/ML IJ SOLN
0.5000 mg | INTRAMUSCULAR | Status: DC | PRN
  Administered 2021-09-24 – 2021-09-25 (×4): 0.5 mg via INTRAVENOUS
  Filled 2021-09-24 (×4): qty 0.5

## 2021-09-24 MED ORDER — OXYCODONE HCL 5 MG PO TABS
5.0000 mg | ORAL_TABLET | Freq: Four times a day (QID) | ORAL | Status: AC | PRN
  Administered 2021-09-24 – 2021-09-25 (×3): 5 mg via ORAL
  Filled 2021-09-24 (×3): qty 1

## 2021-09-24 MED ORDER — POLYETHYLENE GLYCOL 3350 17 G PO PACK
17.0000 g | PACK | Freq: Every day | ORAL | Status: DC
  Administered 2021-09-24 – 2021-09-29 (×6): 17 g via ORAL
  Filled 2021-09-24 (×10): qty 1

## 2021-09-24 MED ORDER — SENNOSIDES-DOCUSATE SODIUM 8.6-50 MG PO TABS
1.0000 | ORAL_TABLET | Freq: Every day | ORAL | Status: DC
  Administered 2021-09-24 – 2021-10-01 (×6): 1 via ORAL
  Filled 2021-09-24 (×7): qty 1

## 2021-09-24 MED ORDER — CHLORHEXIDINE GLUCONATE CLOTH 2 % EX PADS
6.0000 | MEDICATED_PAD | Freq: Every day | CUTANEOUS | Status: DC
  Administered 2021-09-24 – 2021-09-26 (×3): 6 via TOPICAL

## 2021-09-24 NOTE — NC FL2 (Signed)
[place] MEDICAID FL2 LEVEL OF CARE SCREENING TOOL     IDENTIFICATION  Patient Name: Stacy Watts Birthdate: [birth year] Sex: female Admission Date (Current Location): 09/23/2021  [facility] and [place] Number:  Chiropodist and Address:         Provider Number: [id number]  Attending Physician Name and Address:  Lurene Shadow, MD  Relative Name and Phone Number:       Current Level of Care: Hospital Recommended Level of Care: Skilled Nursing Facility Prior Approval Number:    Date Approved/Denied:   PASRR Number: [id number]  Discharge Plan: SNF    Current Diagnoses: Patient Active Problem List   Diagnosis Date Noted   UTI (urinary tract infection) 09/23/2021   Bilateral hydronephrosis 09/23/2021   HTN (hypertension) 09/23/2021   AKI (acute kidney injury) ([facility]) 09/23/2021   Hyponatremia 09/23/2021   Sepsis secondary to UTI ([facility]) 09/23/2021   Intractable headache 01/24/2017   OSA (obstructive sleep apnea) 04/03/2014    Orientation RESPIRATION BLADDER Height & Weight     Self, Time, Situation, Place  Normal Indwelling catheter Weight: 69.8 kg Height:  4\' 11"  (149.9 cm)  BEHAVIORAL SYMPTOMS/MOOD NEUROLOGICAL BOWEL NUTRITION STATUS      Continent Diet (Heart Healthy Card modified)  AMBULATORY STATUS COMMUNICATION OF NEEDS Skin   Extensive Assist Verbally Bruising                       Personal Care Assistance Level of Assistance              Functional Limitations Info  Hearing   Hearing Info: Impaired      SPECIAL CARE FACTORS FREQUENCY  PT (By licensed PT), OT (By licensed OT)                    Contractures Contractures Info: Not present    Additional Factors Info  Code Status, Allergies Code Status Info: Full Allergies Info: Amlodipine, Cefdinir, Ciprofloxacin, Losartan, Nitrofurantoin, Other, Carvedilol, Clonidine           Current Medications (09/24/2021):  This is the current hospital active medication  list Current Facility-Administered Medications  Medication Dose Route Frequency Provider Last Rate Last Admin   0.9 %  sodium chloride infusion   Intravenous Continuous 11/22/2021, MD 75 mL/hr at 09/23/21 2132 New Bag at 09/23/21 2132   ceFEPIme (MAXIPIME) 1 g in sodium chloride 0.9 % 100 mL IVPB  1 g Intravenous Q24H [place], Abby K, RPH       Chlorhexidine Gluconate Cloth 2 % PADS 6 each  6 each Topical Daily 04-22-1983, MD   6 each at 09/24/21 1012   heparin injection 5,000 Units  5,000 Units Subcutaneous Q8H 11/22/21 V, MD   5,000 Units at 09/24/21 11/22/21   hydrALAZINE (APRESOLINE) injection 10 mg  10 mg Intravenous Once [id number], MD       HYDROmorphone (DILAUDID) injection 0.5 mg  0.5 mg Intravenous Q3H PRN Gertha Calkin, MD   0.5 mg at 09/24/21 1247   lidocaine (LIDODERM) 5 % 1 patch  1 patch Transdermal Q24H 11/22/21, MD   1 patch at 09/23/21 1711   oxyCODONE (Oxy IR/ROXICODONE) immediate release tablet 5 mg  5 mg Oral Q6H PRN 11/21/21, MD         Discharge Medications: Please see discharge summary for a list of discharge medications.  Relevant Imaging Results:  Relevant Lab Results:   Additional Information ss Lurene Shadow  Chapman Fitch, RN

## 2021-09-24 NOTE — Plan of Care (Signed)
  Problem: Health Behavior/Discharge Planning: Goal: Ability to manage health-related needs will improve Outcome: Progressing   Problem: Clinical Measurements: Goal: Ability to maintain clinical measurements within normal limits will improve Outcome: Progressing   

## 2021-09-24 NOTE — Progress Notes (Addendum)
Progress Note    Stacy Watts  [id number] DOB: 1930/08/31  DOA: 09/23/2021 PCP: Kirk Ruths, MD      Brief Narrative:    Medical records reviewed and are as summarized below:  Stacy Watts is a 88 y.o. female with medical history significant for CKD stage IIIa, hypertension, dementia, OSA. She was brought to the hospital because of back pain and increasing confusion.  She was recently treated for UTI with a 3-day course of Bactrim (started Bactrim on 09/19/2021).   She was found to have severe sepsis secondary to acute UTI complicated by AKI and acute metabolic encephalopathy.  She was treated with IV fluids and empiric IV antibiotics.     Assessment/Plan:   Principal Problem:   Sepsis secondary to UTI [facility]) Active Problems:   OSA (obstructive sleep apnea)   UTI (urinary tract infection)   Bilateral hydronephrosis   HTN (hypertension)   AKI (acute kidney injury) (HCC)   Hyponatremia    Body mass index is 31.08 kg/m.  (Obesity)   Severe sepsis secondary to UTI: Continue IV cefepime.  Discontinue IV vancomycin and Flagyl.  Follow-up urine and blood cultures.  Remove Foley catheter tomorrow start voiding trial.  AKI on CKD stage IIIa: Creatinine is improving.  Continue IV fluids.  Monitor BMP closely.  Hyponatremia: Continue IV fluids and repeat sodium level tomorrow  Mild bilateral hydronephrosis and hydroureters, pelvic flow laxity with vaginal and rectal prolapse: Outpatient follow-up with urologist and gynecologist recommended.  Acute metabolic encephalopathy with underlying dementia/cognitive decline: Continue supportive care   Diet Order             Diet heart healthy/carb modified Room service appropriate? Yes; Fluid consistency: Thin  Diet effective now                            Consultants: None  Procedures: None    Medications:    Chlorhexidine Gluconate Cloth  6 each Topical Daily   heparin  5,000  Units Subcutaneous Q8H   hydrALAZINE  10 mg Intravenous Once   lidocaine  1 patch Transdermal Q24H   Continuous Infusions:  sodium chloride 75 mL/hr at 09/23/21 2132   ceFEPime (MAXIPIME) IV       Anti-infectives (From admission, onward)    Start     Dose/Rate Route Frequency Ordered Stop   09/24/21 2000  ceFEPIme (MAXIPIME) 1 g in sodium chloride 0.9 % 100 mL IVPB        1 g 200 mL/hr over 30 Minutes Intravenous Every 24 hours 09/23/21 2100     09/24/21 0800  metroNIDAZOLE (FLAGYL) IVPB 500 mg  Status:  Discontinued        500 mg 100 mL/hr over 60 Minutes Intravenous Every 12 hours 09/23/21 2014 09/24/21 1241   09/23/21 2100  vancomycin variable dose per unstable renal function (pharmacist dosing)  Status:  Discontinued         Does not apply See admin instructions 09/23/21 2100 09/24/21 1241   09/23/21 1930  ceFEPIme (MAXIPIME) 2 g in sodium chloride 0.9 % 100 mL IVPB        2 g 200 mL/hr over 30 Minutes Intravenous  Once 09/23/21 1921 09/23/21 2050   09/23/21 1930  metroNIDAZOLE (FLAGYL) IVPB 500 mg        500 mg 100 mL/hr over 60 Minutes Intravenous  Once 09/23/21 1921 09/23/21 2139   09/23/21 1930  vancomycin (VANCOCIN)  IVPB 1000 mg/200 mL premix  Status:  Discontinued        1,000 mg 200 mL/hr over 60 Minutes Intravenous  Once 09/23/21 1921 09/23/21 1929   09/23/21 1930  vancomycin (VANCOREADY) IVPB 1500 mg/300 mL        1,500 mg 150 mL/hr over 120 Minutes Intravenous  Once 09/23/21 1929 09/23/21 2336              Family Communication/Anticipated D/C date and plan/Code Status   DVT prophylaxis: heparin injection 5,000 Units Start: 09/23/21 2200 SCDs Start: 09/23/21 2013     Code Status: Full Code  Family Communication: Plan discussed with Margarita Grizzle, daughter, at the bedside Disposition Plan: Plan to discharge home   Status is: Inpatient Remains inpatient appropriate because: IV antibiotics, awaiting cultures results               Subjective:    Interval events noted.  She is confused on arrival to provided history.  Margarita Grizzle, daughter, was at the bedside  Objective:    Vitals:   09/23/21 2126 09/23/21 2300 09/24/21 0232 09/24/21 0755  BP: (!) 142/57  (!) 123/56 (!) 109/57  Pulse: 92  94 93  Resp: (!) 22  20 16   Temp: 97.7 F (36.5 C)  97.9 F (36.6 C) 98.7 F (37.1 C)  TempSrc: Oral     SpO2: 92%  96% 95%  Weight:  69.8 kg    Height:       No data found.   Intake/Output Summary (Last 24 hours) at 09/24/2021 1250 Last data filed at 09/24/2021 0236 Gross per 24 hour  Intake 441.14 ml  Output 1250 ml  Net -808.86 ml   Filed Weights   09/23/21 1638 09/23/21 2300  Weight: 72.6 kg 69.8 kg    Exam:  GEN: NAD SKIN: Warm and dry EYES: No pallor or icterus ENT: MMM CV: RRR PULM: CTA B ABD: soft, obese, NT, +BS CNS: AAO x 1 (person), non focal EXT: No edema or tenderness        Data Reviewed:   I have personally reviewed following labs and imaging studies:  Labs: Labs show the following:   Basic Metabolic Panel: Recent Labs  Lab 09/23/21 1640 09/24/21 0451  NA 129*  --   K 4.3  --   CL 93*  --   CO2 22  --   GLUCOSE 92  --   BUN 97*  --   CREATININE 3.45* 2.98*  CALCIUM 8.1*  --    GFR Estimated Creatinine Clearance: 11.1 mL/min (A) (by C-G formula based on SCr of 2.98 mg/dL (H)). Liver Function Tests: Recent Labs  Lab 09/23/21 1640  AST 23  ALT 20  ALKPHOS 170*  BILITOT 0.4  PROT 6.8  ALBUMIN 2.8*   Recent Labs  Lab 09/23/21 1640  LIPASE 47   No results for input(s): AMMONIA in the last 168 hours. Coagulation profile No results for input(s): INR, PROTIME in the last 168 hours.  CBC: Recent Labs  Lab 09/23/21 1640  WBC 31.0*  NEUTROABS 23.7*  HGB 13.1  HCT 39.7  MCV 91.1  PLT 215   Cardiac Enzymes: No results for input(s): CKTOTAL, CKMB, CKMBINDEX, TROPONINI in the last 168 hours. BNP (last 3 results) No results for input(s): PROBNP in the last 8760  hours. CBG: No results for input(s): GLUCAP in the last 168 hours. D-Dimer: No results for input(s): DDIMER in the last 72 hours. Hgb A1c: No results for input(s): HGBA1C in the  last 72 hours. Lipid Profile: No results for input(s): CHOL, HDL, LDLCALC, TRIG, CHOLHDL, LDLDIRECT in the last 72 hours. Thyroid function studies: No results for input(s): TSH, T4TOTAL, T3FREE, THYROIDAB in the last 72 hours.  Invalid input(s): FREET3 Anemia work up: No results for input(s): VITAMINB12, FOLATE, FERRITIN, TIBC, IRON, RETICCTPCT in the last 72 hours. Sepsis Labs: Recent Labs  Lab 09/23/21 1640 09/23/21 1836 09/23/21 2212  WBC 31.0*  --   --   LATICACIDVEN  --  1.4 2.3*    Microbiology Recent Results (from the past 240 hour(s))  Resp Panel by RT-PCR (Flu A&B, Covid) Nasopharyngeal Swab     Status: None   Collection Time: 09/23/21  4:55 PM   Specimen: Nasopharyngeal Swab; Nasopharyngeal(NP) swabs in vial transport medium  Result Value Ref Range Status   SARS Coronavirus 2 by RT PCR NEGATIVE NEGATIVE Final    Comment: (NOTE) SARS-CoV-2 target nucleic acids are NOT DETECTED.  The SARS-CoV-2 RNA is generally detectable in upper respiratory specimens during the acute phase of infection. The lowest concentration of SARS-CoV-2 viral copies this assay can detect is 138 copies/mL. A negative result does not preclude SARS-Cov-2 infection and should not be used as the sole basis for treatment or other patient management decisions. A negative result may occur with  improper specimen collection/handling, submission of specimen other than nasopharyngeal swab, presence of viral mutation(s) within the areas targeted by this assay, and inadequate number of viral copies(<138 copies/mL). A negative result must be combined with clinical observations, patient history, and epidemiological information. The expected result is Negative.  Fact Sheet for Patients:   BloggerCourse.comhttps://www.fda.gov/media/152166/download  Fact Sheet for Healthcare Providers:  SeriousBroker.ithttps://www.fda.gov/media/152162/download  This test is no t yet approved or cleared by the [place] FDA and  has been authorized for detection and/or diagnosis of SARS-CoV-2 by FDA under an Emergency Use Authorization (EUA). This EUA will remain  in effect (meaning this test can be used) for the duration of the COVID-19 declaration under Section 564(b)(1) of the Act, 21 U.S.C.section 360bbb-3(b)(1), unless the authorization is terminated  or revoked sooner.       Influenza A by PCR NEGATIVE NEGATIVE Final   Influenza B by PCR NEGATIVE NEGATIVE Final    Comment: (NOTE) The Xpert Xpress SARS-CoV-2/FLU/RSV plus assay is intended as an aid in the diagnosis of influenza from Nasopharyngeal swab specimens and should not be used as a sole basis for treatment. Nasal washings and aspirates are unacceptable for Xpert Xpress SARS-CoV-2/FLU/RSV testing.  Fact Sheet for Patients: BloggerCourse.comhttps://www.fda.gov/media/152166/download  Fact Sheet for Healthcare Providers: SeriousBroker.ithttps://www.fda.gov/media/152162/download  This test is not yet approved or cleared by the [place] FDA and has been authorized for detection and/or diagnosis of SARS-CoV-2 by FDA under an Emergency Use Authorization (EUA). This EUA will remain in effect (meaning this test can be used) for the duration of the COVID-19 declaration under Section 564(b)(1) of the Act, 21 U.S.C. section 360bbb-3(b)(1), unless the authorization is terminated or revoked.  Performed at [facility], [address] [place], [place] [address]     Procedures and diagnostic studies:  CT ABDOMEN PELVIS WO CONTRAST  Result Date: 09/23/2021 CLINICAL DATA:  Back pain, urinary tract infection, lower extremity edema EXAM: CT ABDOMEN AND PELVIS WITHOUT CONTRAST TECHNIQUE: Multidetector CT imaging of the abdomen and pelvis was performed following the standard  protocol without IV contrast. Unenhanced CT was performed per clinician order. Lack of IV contrast limits sensitivity and specificity, especially for evaluation of abdominal/pelvic solid viscera. RADIATION DOSE  REDUCTION: This exam was performed according to the departmental dose-optimization program which includes automated exposure control, adjustment of the mA and/or kV according to patient size and/or use of iterative reconstruction technique. COMPARISON:  None. FINDINGS: Lower chest: No acute pleural or parenchymal lung disease. Mild cardiomegaly with trace pericardial fluid. Hepatobiliary: The gallbladder is surgically absent. Subcentimeter cyst left lobe liver. Otherwise unremarkable unenhanced appearance of the liver. Pancreas: Unremarkable unenhanced appearance. Spleen: Unremarkable unenhanced appearance. Adrenals/Urinary Tract: No evidence of urinary tract calculi within either kidney. There is mild bilateral hydronephrosis and hydroureter, left greater than right, likely due to marked bladder distension. No filling defects or bladder wall irregularity identified. Stomach/Bowel: No bowel obstruction or ileus. Normal appendix right mid abdomen. No bowel wall thickening or inflammatory change. Vascular/Lymphatic: Aortic atherosclerosis. No enlarged abdominal or pelvic lymph nodes. Reproductive: Status post hysterectomy. No adnexal masses. Other: There is evidence of pelvic floor laxity with vaginal and rectal prolapse. No free intraperitoneal fluid or free gas. No abdominal wall hernia. Musculoskeletal: No acute or destructive bony lesions. Reconstructed images demonstrate no additional findings. IMPRESSION: 1. Marked distension of the urinary bladder, with resulting mild bilateral hydronephrosis and hydroureter left greater than right. No urinary tract calculi. 2. Pelvic floor laxity, with vaginal and rectal prolapse. 3.  Aortic Atherosclerosis (ICD10-I70.0). 4. Cardiomegaly with trace pericardial fluid.  Electronically Signed   By: Randa Ngo M.D.   On: 09/23/2021 18:09   DG Chest Portable 1 View  Result Date: 09/23/2021 CLINICAL DATA:  Shortness of breath EXAM: PORTABLE CHEST 1 VIEW COMPARISON:  Previous studies including the examination of 12/07/2010 FINDINGS: Transverse diameter of heart is increased. There is poor inspiration. There are linear densities in the lower lung fields. There are no signs of pulmonary edema or focal pulmonary consolidation. Lateral CP angles are indistinct. There is no pneumothorax. Degenerative changes are noted in the left shoulder. IMPRESSION: Cardiomegaly. Linear densities in the lower lung fields suggest scarring or subsegmental atelectasis. Blunting of lateral CP angles may suggest pleural thickening or minimal effusions. There are no signs of alveolar pulmonary edema or focal pulmonary consolidation. Electronically Signed   By: Elmer Picker M.D.   On: 09/23/2021 17:31               LOS: 1 day   Stacy Watts  [facility]   Pager on [email or web address]. If 7PM-7AM, please contact night-coverage at [email or web address]     09/24/2021, 12:50 PM

## 2021-09-24 NOTE — Evaluation (Signed)
Physical Therapy Evaluation Patient Details Name: Stacy Watts MRN: [id number] DOB: 1931/07/07 Today's Date: 09/24/2021  History of Present Illness  Pt is an 88 y.o. female presenting to hospital 2/6 with c/o UTI and back pain (mid low back).  Pt admitted with sepsis secondary to UTI, AKI (d/t vaginal prolapse), B hydronephrosis, and acute metabolic encephalopathy.  PMH includes htn, vaginal and rectal prolapse.  Clinical Impression  Prior to hospital admission (since last Thursday), pt was requiring 2 person assist to transfer recliner to/from [facility] d/t significant back pain and generalized weakness (prior to last Thursday pt was ambulatory with RW); lives with her son in 1 level home with 3 STE B railings.  Pt's daughter and granddaughter present in room and reporting pt recently receiving IV pain medication d/t significant back pain.  Pt agreeable to trying to get OOB to recliner.  Pt unable to bend R elbow or bring R shoulder into flexion (actively or passively) without grimacing and guarding d/t c/o significant back pain (pt did not report any pain with L UE movement).  Therapist set up room and attempted to logroll pt towards L side (using bed sheet) but pt immediately grimacing and guarding on any attempt d/t reports of significant back pain with any movement (further OOB mobility attempts deferred d/t this)--nurse notified.  Pt would benefit from skilled PT to address noted impairments and functional limitations (see below for any additional details).  Upon hospital discharge, pt may benefit from SNF pending improvement in pain control.    Recommendations for follow up therapy are one component of a multi-disciplinary discharge planning process, led by the attending physician.  Recommendations may be updated based on patient status, additional functional criteria and insurance authorization.  Follow Up Recommendations Skilled nursing-short term rehab (<3 hours/day)    Assistance Recommended  at Discharge Frequent or constant Supervision/Assistance  Patient can return home with the following  Assistance with cooking/housework;Direct supervision/assist for financial management;Assist for transportation;Two people to help with walking and/or transfers;Two people to help with bathing/dressing/bathroom;Direct supervision/assist for medications management;Help with stairs or ramp for entrance    Equipment Recommendations Wheelchair (measurements PT);Wheelchair cushion (measurements PT);Hospital bed;Other (comment) (hoyer lift)  Recommendations for Other Services  OT consult    Functional Status Assessment Patient has had a recent decline in their functional status and demonstrates the ability to make significant improvements in function in a reasonable and predictable amount of time.     Precautions / Restrictions Precautions Precautions: Fall Precaution Comments: Aspiration Restrictions Weight Bearing Restrictions: No      Mobility  Bed Mobility Overal bed mobility: Needs Assistance Bed Mobility: Rolling           General bed mobility comments: attempted with 1 person using bed sheet logrolling towards L side but even with minimal movement pt grimacing in pain and guarding movement so unable to attempt further    Transfers                   General transfer comment: not able to assess at this time    Ambulation/Gait               General Gait Details: not able to assess at this time  Stairs            Wheelchair Mobility    Modified Rankin (Stroke Patients Only)       Balance  Pertinent Vitals/Pain Pain Assessment Pain Assessment: Faces Faces Pain Scale: Hurts even more Pain Descriptors / Indicators: Guarding, Grimacing, Sharp Pain Intervention(s): Limited activity within patient's tolerance, Monitored during session, Premedicated before session, Repositioned, Other  (comment) (RN notified of pt's pain) Vitals (HR and O2 on room air) stable and WFL throughout treatment session.    Home Living Family/patient expects to be discharged to:: Private residence Living Arrangements: Children (pt's son) Available Help at Discharge: Family Type of Home: House Home Access: Stairs to enter Entrance Stairs-Rails: Right;Left;Can reach both Secretary/administrator of Steps: 3   Home Layout: One level Home Equipment: Agricultural consultant (2 wheels);Grab bars - tub/shower;BSC/3in1;Shower seat Additional Comments: No home O2 use; has been sleeping in recliner    Prior Function Prior Level of Function : Independent/Modified Independent             Mobility Comments: Modified independent ambulating with RW (since Thursday pt has needed 2 person assist to transfer recliner to/from [facility] though); 3 weeks ago pt tripped/fell forward but no significant injuries reported ADLs Comments: Assist in/out of shower     Hand Dominance        Extremity/Trunk Assessment   Upper Extremity Assessment Upper Extremity Assessment: RUE deficits/detail;LUE deficits/detail RUE Deficits / Details: fair to good R hand grip strength; pt unable to actively (or allow passive) L elbow flexion or L shoulder flexion before grimacing in pain (pt reporting back pain) RUE: Unable to fully assess due to pain LUE Deficits / Details: good L hand grip strength; at least 3/5 AROM elbow flexion/extension; L shoulder flexion AROM grossly 60 degrees    Lower Extremity Assessment Lower Extremity Assessment: Generalized weakness;RLE deficits/detail;LLE deficits/detail RLE: Unable to fully assess due to pain LLE: Unable to fully assess due to pain    Cervical / Trunk Assessment Cervical / Trunk Assessment: Other exceptions Cervical / Trunk Exceptions: forward head/shoulders  Communication   Communication: HOH  Cognition Arousal/Alertness: Awake/alert Behavior During Therapy: WFL for tasks  assessed/performed                                   General Comments: Oriented to at least self and month/day of birthday (not year)        General Comments General comments (skin integrity, edema, etc.): R>L LE swelling noted.  Pt agreeable to PT session.    Exercises     Assessment/Plan    PT Assessment Patient needs continued PT services  PT Problem List Decreased strength;Decreased activity tolerance;Decreased balance;Decreased mobility;Decreased cognition;Decreased knowledge of use of DME;Decreased knowledge of precautions;Pain       PT Treatment Interventions DME instruction;Gait training;Stair training;Functional mobility training;Therapeutic activities;Therapeutic exercise;Balance training;Patient/family education    PT Goals (Current goals can be found in the Care Plan section)  Acute Rehab PT Goals Patient Stated Goal: to improve pain and mobility PT Goal Formulation: With patient Time For Goal Achievement: 10/08/21 Potential to Achieve Goals: Fair    Frequency Min 2X/week     Co-evaluation               AM-PAC PT "6 Clicks" Mobility  Outcome Measure Help needed turning from your back to your side while in a flat bed without using bedrails?: Total Help needed moving from lying on your back to sitting on the side of a flat bed without using bedrails?: Total Help needed moving to and from a bed to a chair (including a wheelchair)?: Total  Help needed standing up from a chair using your arms (e.g., wheelchair or bedside chair)?: Total Help needed to walk in hospital room?: Total Help needed climbing 3-5 steps with a railing? : Total 6 Click Score: 6    End of Session Equipment Utilized During Treatment: Oxygen Activity Tolerance: Patient limited by pain Patient left: in bed;with call bell/phone within reach;with bed alarm set;with family/visitor present;Other (comment) (B heels floating via pillow support) Nurse Communication: Mobility  status;Precautions;Other (comment) (pt's pain) PT Visit Diagnosis: Other abnormalities of gait and mobility (R26.89);Muscle weakness (generalized) (M62.81);History of falling (Z91.81);Pain Pain - part of body:  (mid low back)    Time: 3435-6861 PT Time Calculation (min) (ACUTE ONLY): 37 min   Charges:   PT Evaluation $PT Eval Low Complexity: 1 Low         Labresha Mellor, PT 09/24/21, 2:33 PM

## 2021-09-24 NOTE — TOC Initial Note (Signed)
Transition of Care [facility]) - Initial/Assessment Note    Patient Details  Name: Stacy Watts MRN: [id number] Date of Birth: [birth year]  [facility]) CM/SW Contact:    Stacy Fitch, RN Phone Number: 09/24/2021, 3:12 PM  Clinical Narrative:                 Admitted for: UTI sepsis Admitted from: home with son.  Daughter and granddaughter assist  PCP: Dareen Piano [facility] Current home health/prior home health/DME:  Agricultural consultant (2 wheels);Grab bars - tub/shower;BSC/3in1;Shower seat Additional Comments: No home O2 use; has been sleeping in recliner  Therapy recommending SNF. Spoke with daughter Stacy Watts. She is agreeable to bed search. But wishes to speak with her siblings about the recommendations  PASRR obtained Fl2 sent for signature Bed search initiated   Expected Discharge Plan: Skilled Nursing Facility Barriers to Discharge: Continued Medical Work up   Patient Goals and CMS Choice        Expected Discharge Plan and Services Expected Discharge Plan: Skilled Nursing Facility   Discharge Planning Services: CM Consult   Living arrangements for the past 2 months: Single Family Home                                      Prior Living Arrangements/Services Living arrangements for the past 2 months: Single Family Home Lives with:: Adult Children Patient language and need for interpreter reviewed:: Yes Do you feel safe going back to the place where you live?: Yes      Need for Family Participation in Patient Care: Yes (Comment) Care giver support system in place?: Yes (comment) Current home services: DME Criminal Activity/Legal Involvement Pertinent to Current Situation/Hospitalization: Yes - Comment as needed  Activities of Daily Living Home Assistive Devices/Equipment: Walker (specify type) ADL Screening (condition at time of admission) Patient's cognitive ability adequate to safely complete daily activities?: Yes Is the patient deaf or  have difficulty hearing?: Yes Does the patient have difficulty seeing, even when wearing glasses/contacts?: No Does the patient have difficulty concentrating, remembering, or making decisions?: No Patient able to express need for assistance with ADLs?: Yes Does the patient have difficulty dressing or bathing?: Yes Independently performs ADLs?: Yes (appropriate for developmental age) Does the patient have difficulty walking or climbing stairs?: Yes Weakness of Legs: Both Weakness of Arms/Hands: None  Permission Sought/Granted                  Emotional Assessment       Orientation: : Oriented to Self, Oriented to Place, Oriented to  Time, Oriented to Situation Alcohol / Substance Use: Not Applicable Psych Involvement: No (comment)  Admission diagnosis:  Urinary retention [R33.9] AKI (acute kidney injury) (HCC) [N17.9] Sepsis (HCC) [A41.9] Sepsis, due to unspecified organism, unspecified whether acute organ dysfunction present [facility]) [A41.9] Patient Active Problem List   Diagnosis Date Noted   UTI (urinary tract infection) 09/23/2021   Bilateral hydronephrosis 09/23/2021   HTN (hypertension) 09/23/2021   AKI (acute kidney injury) (HCC) 09/23/2021   Hyponatremia 09/23/2021   Sepsis secondary to UTI (HCC) 09/23/2021   Intractable headache 01/24/2017   OSA (obstructive sleep apnea) 04/03/2014   PCP:  Lauro Regulus, MD [facility] [address] [place] [facility] [address] [place] [address] Phone: [phone] Fax: [phone]     Social Determinants of Health (SDOH) Interventions  Readmission Risk Interventions No flowsheet data found.

## 2021-09-25 DIAGNOSIS — N390 Urinary tract infection, site not specified: Secondary | ICD-10-CM | POA: Diagnosis not present

## 2021-09-25 DIAGNOSIS — A419 Sepsis, unspecified organism: Secondary | ICD-10-CM | POA: Diagnosis not present

## 2021-09-25 DIAGNOSIS — M549 Dorsalgia, unspecified: Secondary | ICD-10-CM | POA: Diagnosis present

## 2021-09-25 DIAGNOSIS — G9341 Metabolic encephalopathy: Secondary | ICD-10-CM | POA: Diagnosis present

## 2021-09-25 LAB — CBC WITH DIFFERENTIAL/PLATELET
Abs Immature Granulocytes: 2.55 10*3/uL — ABNORMAL HIGH (ref 0.00–0.07)
Basophils Absolute: 0.2 10*3/uL — ABNORMAL HIGH (ref 0.0–0.1)
Basophils Relative: 1 %
Eosinophils Absolute: 0 10*3/uL (ref 0.0–0.5)
Eosinophils Relative: 0 %
HCT: 35 % — ABNORMAL LOW (ref 36.0–46.0)
Hemoglobin: 11.2 g/dL — ABNORMAL LOW (ref 12.0–15.0)
Immature Granulocytes: 11 %
Lymphocytes Relative: 5 %
Lymphs Abs: 1.2 10*3/uL (ref 0.7–4.0)
MCH: 30.4 pg (ref 26.0–34.0)
MCHC: 32 g/dL (ref 30.0–36.0)
MCV: 94.9 fL (ref 80.0–100.0)
Monocytes Absolute: 1.7 10*3/uL — ABNORMAL HIGH (ref 0.1–1.0)
Monocytes Relative: 7 %
Neutro Abs: 18.4 10*3/uL — ABNORMAL HIGH (ref 1.7–7.7)
Neutrophils Relative %: 76 %
Platelets: 256 10*3/uL (ref 150–400)
RBC: 3.69 MIL/uL — ABNORMAL LOW (ref 3.87–5.11)
RDW: 15.4 % (ref 11.5–15.5)
Smear Review: NORMAL
WBC: 24.1 10*3/uL — ABNORMAL HIGH (ref 4.0–10.5)
nRBC: 0 % (ref 0.0–0.2)

## 2021-09-25 LAB — BASIC METABOLIC PANEL
Anion gap: 12 (ref 5–15)
BUN: 70 mg/dL — ABNORMAL HIGH (ref 8–23)
CO2: 20 mmol/L — ABNORMAL LOW (ref 22–32)
Calcium: 8.6 mg/dL — ABNORMAL LOW (ref 8.9–10.3)
Chloride: 108 mmol/L (ref 98–111)
Creatinine, Ser: 2.1 mg/dL — ABNORMAL HIGH (ref 0.44–1.00)
GFR, Estimated: 22 mL/min — ABNORMAL LOW (ref >60)
Glucose, Bld: 116 mg/dL — ABNORMAL HIGH (ref 70–99)
Potassium: 4.1 mmol/L (ref 3.5–5.1)
Sodium: 140 mmol/L (ref 135–145)

## 2021-09-25 LAB — URINE CULTURE: Culture: <10000 — AB

## 2021-09-25 MED ORDER — METHOCARBAMOL 1000 MG/10ML IJ SOLN
500.0000 mg | Freq: Three times a day (TID) | INTRAVENOUS | Status: DC
  Administered 2021-09-25 – 2021-09-26 (×3): 500 mg via INTRAVENOUS
  Filled 2021-09-25: qty 5
  Filled 2021-09-25: qty 500
  Filled 2021-09-25: qty 5

## 2021-09-25 MED ORDER — SODIUM CHLORIDE 0.9 % IV SOLN
2.0000 g | INTRAVENOUS | Status: DC
  Administered 2021-09-25 – 2021-09-27 (×3): 2 g via INTRAVENOUS
  Filled 2021-09-25 (×4): qty 2

## 2021-09-25 MED ORDER — PREDNISONE 20 MG PO TABS
20.0000 mg | ORAL_TABLET | Freq: Every day | ORAL | Status: DC
  Administered 2021-09-25 – 2021-09-28 (×4): 20 mg via ORAL
  Filled 2021-09-25 (×4): qty 1

## 2021-09-25 MED ORDER — ACETAMINOPHEN 325 MG PO TABS
650.0000 mg | ORAL_TABLET | Freq: Four times a day (QID) | ORAL | Status: DC | PRN
  Administered 2021-09-25 – 2021-09-30 (×8): 650 mg via ORAL
  Filled 2021-09-25 (×9): qty 2

## 2021-09-25 NOTE — Assessment & Plan Note (Addendum)
Persistent.  Patient has been lethargic for past few days.  AMS was present on admission.   Continue treating infection as outlined.  Supportive care / delirium precautions.  2/14: Pt remains drowsy & somnolent, waxes and wanes.  Responds appropriately when awake.

## 2021-09-25 NOTE — Assessment & Plan Note (Addendum)
Baseline creatinine appears around 1.2.  Presented with AKI as outlined. Monitor renal function. Creatinine today 1.16 stable, at baseline.

## 2021-09-25 NOTE — Assessment & Plan Note (Addendum)
Due to anatomical urinary retention in the setting of vaginal and rectal prolapse. -- Foley discontinued but pt had ongoing retention.   Replaced Foley --Cefepime initially >> Merrem and Vanc on PM of 2/10 given spinal infection seen on MRI's --Abx per ID.  Now on Rocephin

## 2021-09-25 NOTE — Assessment & Plan Note (Addendum)
Resolved.  Present on admission, acute.   Likely hypovolemic in the setting of poor p.o. intake.  Presented with sodium 129, improved with IV fluids.  Monitor BMP.

## 2021-09-25 NOTE — Progress Notes (Signed)
Pharmacy Antibiotic Note  Stacy Watts is a 88 y.o. female admitted on 09/23/2021 with back pain. Patient took Bactrim PTA for UTI with initial improvement in symptoms until 2/5 when she started having back pain. Pharmacy has been consulted for cefepime dosing. Patient presented with AKI, creatinine 3.45 on admission which has improved. Baseline appears to be around 1.0 based on labs in recent months.  Plan:  Adjust cefepime dose to 2 grams IV every 24 hours  Continue to follow renal function for needed dose adjustments  Height: 4\' 11"  (149.9 cm) Weight: 69.8 kg (153 lb 14.1 oz) IBW/kg (Calculated) : 43.2  Temp (24hrs), Avg:98.9 F (37.2 C), Min:98.7 F (37.1 C), Max:99.1 F (37.3 C)  Recent Labs  Lab 09/23/21 1640 09/23/21 1836 09/23/21 2212 09/24/21 0451 09/25/21 0357  WBC 31.0*  --   --   --  24.1*  CREATININE 3.45*  --   --  2.98* 2.10*  LATICACIDVEN  --  1.4 2.3*  --   --      Estimated Creatinine Clearance: 15.7 mL/min (A) (by C-G formula based on SCr of 2.1 mg/dL (H)).    Allergies  Allergen Reactions   Amlodipine     Other reaction(s): Dizziness   Cefdinir     Other reaction(s): Unknown   Ciprofloxacin     Other reaction(s): Unknown   Losartan     Other reaction(s): Angioedema   Nitrofurantoin Swelling   Other     UNKNOWN BLOOD PRESSURE MEDICATION   Carvedilol Rash   Clonidine Rash and Swelling    Antimicrobials this admission: vancomycin 2/6 x 1 cefepime 2/6 >>  Microbiology results: 2/6 BCx: NGTD 2/6 UCx: < 10k CFU (on Bactrim PTA)   Thank you for allowing pharmacy to be a part of this patients care.  11/23/21, PharmD, BCPS Clinical Pharmacist 09/25/2021 10:17 AM

## 2021-09-25 NOTE — Assessment & Plan Note (Addendum)
Presented with creatinine 3.45, baseline appears around 1.2.  Foley was placed on admission due to obstructive uropathy secondary to vaginal and rectal prolapse. Improved with IV fluids and Foley. Foley had to be replaced, unsuccessful voiding trial 2/9-2/10, Foley replaced. Cr now at baseline.   Monitor BMP.

## 2021-09-25 NOTE — Hospital Course (Signed)
Stacy Watts is a 88 y.o. female with medical history significant for CKD stage IIIa, hypertension, dementia, OSA. She was brought to the hospital because of back pain and increasing confusion.  She was recently treated for UTI with a 3-day course of Bactrim (started Bactrim on 09/19/2021).    She was found to have severe sepsis secondary to acute UTI complicated by AKI and acute metabolic encephalopathy.  She was treated with IV fluids and empiric IV antibiotics.

## 2021-09-25 NOTE — Assessment & Plan Note (Addendum)
Due to osteomyelitis / discitis / epidural abscess as seen on MRI's 09/27/21.  2/14: pain uncontrolled, increased Norco dose --Robaxin and Norco as needed -- Tylenol as needed --Stopped low-dose gabapentin, too sedating although seemed to have some improvement in her pain -- PT/OT

## 2021-09-25 NOTE — Evaluation (Signed)
Occupational Therapy Evaluation Patient Details Name: Stacy Watts MRN: [id number] DOB: Feb 19, 1931 Today's Date: 09/25/2021   History of Present Illness 88 y.o. female who [facility] with back pain and increasing confusion. She was found to have severe sepsis secondary to acute UTI complicated by AKI and acute metabolic encephalopathy. PMH significant for: HTN, vaginal/rectal prolapse, CKD stage IIIa, dementia, OSA   Clinical Impression   Pt seen for OT evaluation this date. Upon arrival to room, pt sitting upright in bed, with non-rebreather masked donned. RN okay'd this Chief Strategy Officer to don pt with [place] for OT session (SpO2>95% throughout session while on 7L/min via [place]; RN informed). Pt's daughter at bedside and able to provide PLOF/home set-up information (see below) in setting of pt with impaired cognition. This date, pt A&Ox2 and reporting no pain at rest, however grimacing in with any movement/touch. Pt was initially unable to localize where pain was occurring, however following bed mobility, pt reported significant back pain; RN informed. Due to pt's back pain, decreased activity tolerance, and impaired cognition, pt required MAX A for rolling R&L in bed and MAX A+2 for scooting body toward HOB. Pt maintained side-lying position for ~4 mins without pain while RN applied sacral pad. This author attempted to engage pt in bed-level grooming tasks, however pt was unable to maintain grasp when bringing task items to face, and ultimately required MAX A via HHA to perform. Pt requires significantly more assistance for functional tasks compared to baseline and would benefit from skilled OT services at SNF prior to return home to maximize return to PLOF and minimize risk of future falls, injury, caregiver burden, and readmission.     Recommendations for follow up therapy are one component of a multi-disciplinary discharge planning process, led by the attending physician.  Recommendations may be  updated based on patient status, additional functional criteria and insurance authorization.   Follow Up Recommendations  Skilled nursing-short term rehab (<3 hours/day)    Assistance Recommended at Discharge Frequent or constant Supervision/Assistance  Patient can return home with the following A lot of help with walking and/or transfers;A lot of help with bathing/dressing/bathroom    Functional Status Assessment  Patient has had a recent decline in their functional status and demonstrates the ability to make significant improvements in function in a reasonable and predictable amount of time.  Equipment Recommendations  Other (comment) (defer to next venue of care)       Precautions / Restrictions Precautions Precautions: Fall Precaution Comments: Aspiration Restrictions Weight Bearing Restrictions: No      Mobility Bed Mobility Overal bed mobility: Needs Assistance Bed Mobility: Rolling Rolling: Max assist, +2 for physical assistance         General bed mobility comments: With MAX A of 1-person and use of bedsheet, rolls R&L and able to maintain side-lying position in bed for RN to apply sacral pad. Requires +2 assist to scoot body toward [facility]    Transfers                   General transfer comment: not able to assess at this time          ADL either performed or assessed with clinical judgement   ADL Overall ADL's : Needs assistance/impaired                                       General ADL Comments:  Attempted to engage pt in bed-level grooming tasks, however pt unable to maintain grasp when bringing task items to face, and ultimately requiring MAX A via HHA to perform. Requries MAX-TOTAL A for bed-level LB ADLs      Pertinent Vitals/Pain Pain Assessment Pain Assessment: Faces Faces Pain Scale: Hurts even more Pain Location: back Pain Descriptors / Indicators: Guarding, Grimacing, Sharp Pain Intervention(s): Monitored during session,  Repositioned, Patient requesting pain meds-RN notified        Extremity/Trunk Assessment Upper Extremity Assessment Upper Extremity Assessment: RUE deficits/detail;LUE deficits/detail RUE Deficits / Details: fair to good R hand grip strength; pt unable to actively (or allow passive) L elbow flexion or L shoulder flexion before grimacing in pain (pt reporting back pain) LUE Deficits / Details: good L hand grip strength; at least 3/5 AROM elbow flexion/extension; L shoulder flexion AROM grossly 60 degrees   Lower Extremity Assessment Lower Extremity Assessment: Generalized weakness RLE: Unable to fully assess due to pain LLE: Unable to fully assess due to pain       Communication Communication Communication: HOH   Cognition Arousal/Alertness: Awake/alert Behavior During Therapy: Anxious Overall Cognitive Status: History of cognitive impairments - at baseline                                 General Comments: Pt alert and oriented to self and place. Inconsistently follows 1-step instructions following multi-modal cues     General Comments  R>L LE swelling noted            Home Living Family/patient expects to be discharged to:: Private residence Living Arrangements: Children (pt's son) Available Help at Discharge: Family Type of Home: House Home Access: Stairs to enter Technical brewer of Steps: 3 Entrance Stairs-Rails: Right;Left;Can reach both Home Layout: One level     Bathroom Shower/Tub: Teacher, early years/pre: Standard     Home Equipment: Conservation officer, nature (2 wheels);Grab bars - tub/shower;BSC/3in1;Shower seat   Additional Comments: No home O2 use; has been sleeping in recliner      Prior Functioning/Environment Prior Level of Function : Needs assist       Physical Assist : ADLs (physical)   ADLs (physical): Bathing Mobility Comments: Modified independent ambulating with RW (since Thursday pt has needed 2 person assist to  transfer recliner to/from [facility] though); 3 weeks ago pt tripped/fell forward but no significant injuries reported ADLs Comments: Independent with feeding, grooming, dressing, and toileting. Required assistance for bathing/bath transfers        OT Problem List: Decreased strength;Decreased activity tolerance;Impaired balance (sitting and/or standing);Decreased cognition;Pain;Increased edema      OT Treatment/Interventions: Self-care/ADL training;Therapeutic exercise;DME and/or AE instruction;Therapeutic activities;Patient/family education;Balance training    OT Goals(Current goals can be found in the care plan section) Acute Rehab OT Goals OT Goal Formulation: Patient unable to participate in goal setting Time For Goal Achievement: 10/09/21 Potential to Achieve Goals: Fair ADL Goals Pt Will Perform Grooming: with min assist;bed level Pt Will Perform Upper Body Dressing: with min assist;sitting Pt Will Perform Lower Body Dressing: with mod assist;sitting/lateral leans  OT Frequency: Min 2X/week       AM-PAC OT "6 Clicks" Daily Activity     Outcome Measure Help from another person eating meals?: A Lot Help from another person taking care of personal grooming?: A Lot Help from another person toileting, which includes using toliet, bedpan, or urinal?: Total Help from another person bathing (including washing,  rinsing, drying)?: Total Help from another person to put on and taking off regular upper body clothing?: A Lot Help from another person to put on and taking off regular lower body clothing?: Total 6 Click Score: 9   End of Session Equipment Utilized During Treatment: Oxygen (7Ls) Nurse Communication: Mobility status;Patient requests pain meds  Activity Tolerance: Patient limited by pain Patient left: in bed;with call bell/phone within reach;with bed alarm set;with nursing/sitter in room;with family/visitor present  OT Visit Diagnosis: Muscle weakness (generalized)  (M62.81);Pain Pain - part of body:  (back)                Time: 1035-1105 OT Time Calculation (min): 30 min Charges:  OT General Charges $OT Visit: 1 Visit OT Evaluation $OT Eval Moderate Complexity: 1 Mod OT Treatments $Self Care/Home Management : 8-22 mins  Fredirick Maudlin, OTR/L [place]

## 2021-09-25 NOTE — Assessment & Plan Note (Addendum)
Blood pressures have been elevated, possibly related to pain. Continue torsemide  As needed hydralazine  Added diltiazem 120 mg daily, stopped today due to softer BP's with pain med Titrate antihypertensives

## 2021-09-25 NOTE — Progress Notes (Addendum)
PT Cancellation Note  Patient Details Name: Stacy Watts MRN: [id number] DOB: 09/14/1930   Cancelled Treatment:     PT attempted several time throughout the afternoon. Pt is too lethargic to participate. Pt was given ms relaxer and now is unable to stay awake long enough to participate. PT will return tomorrow and continue to follow per current POC.    Rushie Chestnut 09/25/2021, 5:13 PM

## 2021-09-25 NOTE — Assessment & Plan Note (Signed)
CPAP ordered

## 2021-09-25 NOTE — TOC Progression Note (Signed)
Transition of Care [facility]) - Progression Note    Patient Details  Name: Stacy Watts MRN: [id number] Date of Birth: 1931/01/29  [facility]) CM/SW Contact  Margarito Liner, LCSW Phone Number: 09/25/2021, 1:06 PM  Clinical Narrative:  Provided bed offers to patient and daughter. They will review and follow up with decision. Leaning towards [facility].   Expected Discharge Plan: Skilled Nursing Facility Barriers to Discharge: Continued Medical Work up  Expected Discharge Plan and Services Expected Discharge Plan: Skilled Nursing Facility   Discharge Planning Services: CM Consult   Living arrangements for the past 2 months: Single Family Home                                       Social Determinants of Health (SDOH) Interventions    Readmission Risk Interventions No flowsheet data found.

## 2021-09-25 NOTE — Plan of Care (Signed)

## 2021-09-25 NOTE — Progress Notes (Signed)
Progress Note   Patient: Stacy Watts DOB: September 19, 1930 DOA: 09/23/2021     2 DOS: the patient was seen and examined on 09/25/2021   Brief hospital course: Stacy Watts is a 88 y.o. female with medical history significant for CKD stage IIIa, hypertension, dementia, OSA. She was brought to the hospital because of back pain and increasing confusion.  She was recently treated for UTI with a 3-day course of Bactrim (started Bactrim on 09/19/2021).    She was found to have severe sepsis secondary to acute UTI complicated by AKI and acute metabolic encephalopathy.  She was treated with IV fluids and empiric IV antibiotics.  Assessment and Plan: * Sepsis secondary to UTI [facility])- (present on admission) Presented with leukocytosis, tachycardia, AKI and lactic acidosis consistent with organ dysfunction.  Treated per protocol on admission.  Continue IV antibiotics and follow cultures.  UTI (urinary tract infection)- (present on admission) Due to anatomical urinary retention in the setting of vaginal and rectal prolapse. --Continue Foley -- Continue empiric cefepime  -- Continue gentle IV fluids for AKI   Back pain- (present on admission) Patient appears to be having acute on chronic back pain.  She is a poor historian however.  Family reported episodes and I witnessed episodes of patient suddenly wincing in pain briefly.  She also complains of leg pain but unable to characterize this. Clinically suspect lumbar etiology with spasms and possibly radicular component. -- Trial Robaxin, suspect muscle spasms --Prednisone for anti-inflammatory given NSAIDs contraindicated with renal failure -- Tylenol as needed -- Hold off lumbar MRI for now -- Consider trial of gabapentin for radicular pain if not improved with muscle relaxer -- PT/OT  Hyponatremia- (present on admission) Present on admission, acute.   Likely hypovolemic in the setting of poor p.o. intake.  Presented with  sodium 129, improved with IV fluids.  Monitor BMP.  AKI (acute kidney injury) ([place])- (present on admission) Presented with creatinine 3.45, baseline appears around 1.2.  Continue IV fluids and Foley for obstructive uropathy.  Monitor BMP daily.  Acute metabolic encephalopathy- (present on admission) Present on admission likely due to infection with underlying dementia or cognitive impairment.  Continue treating infection as outlined.  Supportive care.  Delirium precautions.  Bilateral hydronephrosis- (present on admission) Due to pelvic floor laxity causing vaginal and rectal prolapse.  Continue Foley.  Outpatient follow-up with urology and/or gynecology. Likely voiding trial tomorrow if back pain better controlled   HTN (hypertension)- (present on admission) Blood pressures overall stable.   Hold home torsemide given AKI. As needed hydralazine for now.  OSA (obstructive sleep apnea)- (present on admission) CPAP ordered   CKD (chronic kidney disease) stage 3, GFR 30-59 ml/min (HCC) Baseline creatinine appears around 1.2.  Presented with AKI as outlined. Monitor renal function.        Subjective: Patient seen awake resting in bed with 2 daughters at bedside.  She reports back pain at times.  Daughters report intermittent intense episodes during which the patient will wince in pain for a few seconds and then it resolves.  Pain is exacerbated with any movement.  She apparently did not tolerate working with therapy yesterday.  Patient also reports that her legs ache and that she hurts everywhere.  Physical Exam: Vitals:   09/24/21 2259 09/25/21 0005 09/25/21 0817 09/25/21 1300  BP: (!) 143/37 (!) 152/72 138/64   Pulse: 97 91 86 90  Resp: 17 16 17    Temp:  99.1 F (37.3 C) 98.9 F (37.2  C)   TempSrc:  Oral    SpO2: 100% 92% 96% 97%  Weight:      Height:       General exam: awake, alert, no acute distress HEENT: Hard of hearing, moist mucous membranes Respiratory system:  CTA, no wheezes, rales or rhonchi, normal respiratory effort. Cardiovascular system: normal S1/S2, RRR, bilateral lower extremity pitting edema.   Gastrointestinal system: soft, NT, ND, no HSM felt, +bowel sounds. Genitourinary system: Foley in place with clear yellow urine in bag Central nervous system: no gross focal neurologic deficits, normal speech Extremities: moves all, normal tone Skin: dry, intact, normal temperature Psychiatry: normal mood, congruent affect   Data Reviewed:  Labs reviewed and notable for sodium 140, bicarb 20, glucose 116, BUN 70, creatinine improved from 3.45-2.10, lactic acid 2.3, up from 1.4, white count improved from 31 down to 24.1 neutrophil predominance.  Urine culture with insignificant growth.  Blood cultures negative to date.  Family Communication: 2 daughters at bedside on rounds  Disposition: Status is: Inpatient Remains inpatient appropriate because: Severity of illness: Patient remains on IV antibiotics and IV fluids for AKI          Planned Discharge Destination: Skilled nursing facility     Time spent: 35 minutes  Author: Ezekiel Slocumb, DO 09/25/2021 2:27 PM  For on call review [email or web address].

## 2021-09-25 NOTE — Assessment & Plan Note (Addendum)
Due to pelvic floor laxity causing vaginal and rectal prolapse.    Voiding trial attempted, still with ongoing retention.   Foley replaced.  Outpatient follow-up with urology and/or gynecology.

## 2021-09-25 NOTE — Assessment & Plan Note (Addendum)
Presented with leukocytosis, tachycardia, AKI and lactic acidosis consistent with organ dysfunction.  Treated per protocol on admission.  Continue IV antibiotics and follow cultures.  Infection source is  spinal infection.

## 2021-09-26 DIAGNOSIS — N390 Urinary tract infection, site not specified: Secondary | ICD-10-CM | POA: Diagnosis not present

## 2021-09-26 DIAGNOSIS — A419 Sepsis, unspecified organism: Secondary | ICD-10-CM | POA: Diagnosis not present

## 2021-09-26 LAB — CBC
HCT: 35.6 % — ABNORMAL LOW (ref 36.0–46.0)
Hemoglobin: 11.5 g/dL — ABNORMAL LOW (ref 12.0–15.0)
MCH: 30.4 pg (ref 26.0–34.0)
MCHC: 32.3 g/dL (ref 30.0–36.0)
MCV: 94.2 fL (ref 80.0–100.0)
Platelets: 344 10*3/uL (ref 150–400)
RBC: 3.78 MIL/uL — ABNORMAL LOW (ref 3.87–5.11)
RDW: 15.5 % (ref 11.5–15.5)
WBC: 20.4 10*3/uL — ABNORMAL HIGH (ref 4.0–10.5)
nRBC: 0 % (ref 0.0–0.2)

## 2021-09-26 LAB — BASIC METABOLIC PANEL
Anion gap: 9 (ref 5–15)
BUN: 45 mg/dL — ABNORMAL HIGH (ref 8–23)
CO2: 24 mmol/L (ref 22–32)
Calcium: 9 mg/dL (ref 8.9–10.3)
Chloride: 112 mmol/L — ABNORMAL HIGH (ref 98–111)
Creatinine, Ser: 1.32 mg/dL — ABNORMAL HIGH (ref 0.44–1.00)
GFR, Estimated: 39 mL/min — ABNORMAL LOW (ref >60)
Glucose, Bld: 136 mg/dL — ABNORMAL HIGH (ref 70–99)
Potassium: 4.6 mmol/L (ref 3.5–5.1)
Sodium: 145 mmol/L (ref 135–145)

## 2021-09-26 LAB — MAGNESIUM: Magnesium: 2.7 mg/dL — ABNORMAL HIGH (ref 1.7–2.4)

## 2021-09-26 LAB — GLUCOSE, CAPILLARY: Glucose-Capillary: 179 mg/dL — ABNORMAL HIGH (ref 70–99)

## 2021-09-26 MED ORDER — TORSEMIDE 20 MG PO TABS
20.0000 mg | ORAL_TABLET | Freq: Two times a day (BID) | ORAL | Status: DC
  Administered 2021-09-27 – 2021-10-02 (×10): 20 mg via ORAL
  Filled 2021-09-26 (×12): qty 1

## 2021-09-26 MED ORDER — METHOCARBAMOL 500 MG PO TABS: 500.0000 mg | ORAL_TABLET | Freq: Three times a day (TID) | ORAL | Status: DC | PRN

## 2021-09-26 MED ORDER — GABAPENTIN 100 MG PO CAPS
200.0000 mg | ORAL_CAPSULE | Freq: Two times a day (BID) | ORAL | Status: DC
  Administered 2021-09-26 – 2021-09-27 (×3): 200 mg via ORAL
  Filled 2021-09-26 (×3): qty 2

## 2021-09-26 MED ORDER — HYDRALAZINE HCL 20 MG/ML IJ SOLN
5.0000 mg | Freq: Four times a day (QID) | INTRAMUSCULAR | Status: DC | PRN
  Administered 2021-09-26: 5 mg via INTRAVENOUS
  Filled 2021-09-26: qty 1

## 2021-09-26 MED ORDER — HYDRALAZINE HCL 25 MG PO TABS: 25.0000 mg | ORAL_TABLET | Freq: Four times a day (QID) | ORAL | Status: DC | PRN

## 2021-09-26 NOTE — Care Management Important Message (Signed)
Important Message  Patient Details  Name: Stacy Watts MRN: [id number] Date of Birth: 06-26-1931   Medicare Important Message Given:  Yes     [name] 09/26/2021, 12:58 PM

## 2021-09-26 NOTE — Progress Notes (Signed)
Progress Note   Patient: Stacy Watts [id number] DOB: 09/08/1930 DOA: 09/23/2021     3 DOS: the patient was seen and examined on 09/26/2021   Brief hospital course: Stacy Watts is a 88 y.o. female with medical history significant for CKD stage IIIa, hypertension, dementia, OSA. She was brought to the hospital because of back pain and increasing confusion.  She was recently treated for UTI with a 3-day course of Bactrim (started Bactrim on 09/19/2021).    She was found to have severe sepsis secondary to acute UTI complicated by AKI and acute metabolic encephalopathy.  She was treated with IV fluids and empiric IV antibiotics.  Assessment and Plan: * Sepsis secondary to UTI [facility])- (present on admission) Presented with leukocytosis, tachycardia, AKI and lactic acidosis consistent with organ dysfunction.  Treated per protocol on admission.  Continue IV antibiotics and follow cultures.  UTI (urinary tract infection)- (present on admission) Due to anatomical urinary retention in the setting of vaginal and rectal prolapse. -- DC Foley this afternoon for voiding trial -- Continue empiric cefepime  -- Stop IV fluids, renal function recovered   Back pain- (present on admission) Patient appears to be having acute on chronic back pain.  She is a poor historian however.  Family reported episodes and I witnessed episodes of patient suddenly wincing in pain briefly.  She also complains of leg pain but unable to characterize this. Clinically suspect lumbar etiology with spasms and possibly radicular component. -- Trial Robaxin, will make as needed given patient seems sedated per staff --Prednisone for anti-inflammatory given NSAIDs contraindicated with renal failure -- Tylenol as needed -- Hold off lumbar MRI for now per family discussion -- Trial low-dose gabapentin -- PT/OT  Hyponatremia- (present on admission) Resolved.  Present on admission, acute.   Likely hypovolemic in the  setting of poor p.o. intake.  Presented with sodium 129, improved with IV fluids.  Monitor BMP.  AKI (acute kidney injury) ([place])- (present on admission) Presented with creatinine 3.45, baseline appears around 1.2.  Foley was placed on admission due to obstructive uropathy secondary to vaginal and rectal prolapse -- Stop IV fluids  -- DC Foley for voiding trial this afternoon monitor BMP daily. Creatinine today 1.32, near baseline  Acute metabolic encephalopathy- (present on admission) Present on admission likely due to infection with underlying dementia or cognitive impairment.  Continue treating infection as outlined.  Supportive care.  Delirium precautions.  Bilateral hydronephrosis- (present on admission) Due to pelvic floor laxity causing vaginal and rectal prolapse.   DC Foley this afternoon for voiding trial.  Outpatient follow-up with urology and/or gynecology.   HTN (hypertension)- (present on admission) Blood pressures overall stable.   Resume home torsemide this evening which had been held for AKI. As needed hydralazine for now.  OSA (obstructive sleep apnea)- (present on admission) CPAP ordered   CKD (chronic kidney disease) stage 3, GFR 30-59 ml/min (HCC) Baseline creatinine appears around 1.2.  Presented with AKI as outlined. Monitor renal function. Creatinine today 1.32, improving        Subjective: Patient was sleeping but arousable when seen today.  Daughter at bedside.  She reports ongoing episodes of back pain.  Apparently no bowel movement since at home on the sixth.  Patient is unable to specify any other acute complaints.  No acute events reported.  Daughter is agreeable to trying low-dose gabapentin  Physical Exam: Vitals:   09/25/21 1948 09/26/21 0439 09/26/21 0821 09/26/21 1346  BP: (!) 159/70 (!) 178/79 Marland Kitchen)  170/86 (!) 168/67  Pulse: 95 94 97 93  Resp: 18 16 18 16   Temp: 98.9 F (37.2 C) 99.6 F (37.6 C) 99.6 F (37.6 C) 99.4 F (37.4 C)   TempSrc: Oral Oral Oral Oral  SpO2: 96% 96% 95% 96%  Weight:      Height:       General exam: Sleeping comfortably, wakes to voice and physical stimulation but falls back to sleep quickly, no acute distress HEENT: atraumatic, clear conjunctiva, anicteric sclera, moist mucus membranes, hard of hearing  Respiratory system: CTAB, no wheezes, rales or rhonchi, normal respiratory effort. Cardiovascular system: normal S1/S2, RRR, bilateral lower extremity edema appears stable.   Gastrointestinal system: soft, NT, ND, no HSM felt, +bowel sounds. Central nervous system: Lethargic no gross focal neurologic deficits, normal speech Skin: dry, intact, normal temperature Psychiatry: normal mood, congruent affect, judgement and insight appear normal   Data Reviewed:  Labs reviewed and notable for chloride 112, glucose 136, BUN 45, creatinine improved to 1.32, mag 2.7, WBC 20.4, hemoglobin stable 11.5  Family Communication: Daughter at bedside on rounds  Disposition: Status is: Inpatient Remains inpatient appropriate because: Severity of illness as outlined above.  DC Foley and monitor renal function.  Uncontrolled back pain with medication changes and evaluation underway          Planned Discharge Destination: Skilled nursing facility     Time spent: 35 minutes  Author: Ezekiel Slocumb, DO 09/26/2021 2:22 PM  For on call review [email or web address].

## 2021-09-26 NOTE — Progress Notes (Signed)
Physical Therapy Treatment Patient Details Name: Stacy Watts MRN: [id number] DOB: September 10, 1930 Today's Date: 09/26/2021   History of Present Illness 88 y.o. female who presented to [facility] 2/6 with back pain and increasing confusion. She was found to have severe sepsis secondary to acute UTI complicated by AKI and acute metabolic encephalopathy. PMH significant for: HTN, vaginal/rectal prolapse, CKD stage IIIa, dementia, OSA    PT Comments    Pt sleeping in bed upon PT arrival (pt's daughter and nurse reports pt was alert earlier today).  Pt's daughter present and agreeable to trying to wake pt up.  Pt given vc's, tactile cues, and sternal rub but pt would only briefly open her eyes and occasionally talk.  Attempted to sit pt up (long-sitting in bed) but still unable to stimulate pt to increase level of alertness.  Pt repositioned in bed with 2 assist--pt did intermittently yell out in pain briefly during repositioning but did not open her eyes).  Nurse notified and assessing pt.  Will continue to attempt to improve pt's strength and progressive functional mobility.      Recommendations for follow up therapy are one component of a multi-disciplinary discharge planning process, led by the attending physician.  Recommendations may be updated based on patient status, additional functional criteria and insurance authorization.  Follow Up Recommendations  Skilled nursing-short term rehab (<3 hours/day)     Assistance Recommended at Discharge    Patient can return home with the following Assistance with cooking/housework;Direct supervision/assist for financial management;Assist for transportation;Two people to help with walking and/or transfers;Two people to help with bathing/dressing/bathroom;Direct supervision/assist for medications management;Help with stairs or ramp for entrance   Equipment Recommendations  Wheelchair (measurements PT);Wheelchair cushion (measurements PT);Hospital bed;Other  (comment) (hoyer lift)    Recommendations for Other Services OT consult     Precautions / Restrictions Precautions Precautions: Fall Precaution Comments: Aspiration Restrictions Weight Bearing Restrictions: No     Mobility  Bed Mobility Overal bed mobility: Needs Assistance             General bed mobility comments: total assist (using bed sheet) semi-supine to longsitting in bed and returned to semi-supine (pt then repositioned in bed with 2 assist)    Transfers                   General transfer comment: not able to assess at this time    Ambulation/Gait               General Gait Details: not able to assess at this time   Stairs             Wheelchair Mobility    Modified Rankin (Stroke Patients Only)       Balance                                            Cognition Arousal/Alertness: Lethargic   Overall Cognitive Status: Difficult to assess                                          Exercises      General Comments General comments (skin integrity, edema, etc.): R>L LE swelling noted.  Nursing cleared pt for participation in physical therapy.  Pt's daughter agreeable to PT session.  Pertinent Vitals/Pain Pain Assessment Pain Assessment: Faces Faces Pain Scale:  (0/10 at rest; briefly 6-8/10 with movement) Pain Location: back Pain Descriptors / Indicators: Guarding, Grimacing Pain Intervention(s): Limited activity within patient's tolerance, Monitored during session, Premedicated before session, Repositioned    Home Living                          Prior Function            PT Goals (current goals can now be found in the care plan section) Acute Rehab PT Goals Patient Stated Goal: to improve pain and mobility PT Goal Formulation: With patient Time For Goal Achievement: 10/08/21 Potential to Achieve Goals: Fair Progress towards PT goals: Not progressing toward goals -  comment (d/t impaired level of alertness)    Frequency    Min 2X/week      PT Plan Current plan remains appropriate    Co-evaluation              AM-PAC PT "6 Clicks" Mobility   Outcome Measure  Help needed turning from your back to your side while in a flat bed without using bedrails?: Total Help needed moving from lying on your back to sitting on the side of a flat bed without using bedrails?: Total Help needed moving to and from a bed to a chair (including a wheelchair)?: Total Help needed standing up from a chair using your arms (e.g., wheelchair or bedside chair)?: Total Help needed to walk in hospital room?: Total Help needed climbing 3-5 steps with a railing? : Total 6 Click Score: 6    End of Session Equipment Utilized During Treatment: Oxygen Activity Tolerance: Patient limited by fatigue Patient left: in bed;with call bell/phone within reach;with bed alarm set;with nursing/sitter in room;with family/visitor present;Other (comment) (B heels floating via pillow support) Nurse Communication: Mobility status;Precautions;Other (comment) (pt's level of alertness) PT Visit Diagnosis: Other abnormalities of gait and mobility (R26.89);Muscle weakness (generalized) (M62.81);History of falling (Z91.81);Pain Pain - part of body:  (mid low back)     Time: [id number] PT Time Calculation (min) (ACUTE ONLY): 21 min  Charges:  $Therapeutic Activity: 8-22 mins                    Leitha Bleak, PT 09/26/21, 4:30 PM

## 2021-09-26 NOTE — TOC Progression Note (Addendum)
Transition of Care [facility]) - Progression Note    Patient Details  Name: Stacy Watts MRN: [id number] Date of Birth: 03/01/1931  Transition of [facility] CM/SW [place], LCSW Phone Number: 09/26/2021, 11:51 AM  Clinical Narrative:  Met with daughters at bedside. They have accepted bed offer from [facility]. Per MD, hoping to discharge in 1-2 days. Will start auth process today. Left message for [facility] admissions coordinator to notify.   1:08 pm: Uploaded clinicals into [place] portal to start insurance authorization.  Expected Discharge Plan: [place] Barriers to Discharge: Continued Medical Work up  Expected Discharge Plan and Services Expected Discharge Plan: [place]   Discharge Planning Services: CM Consult   Living arrangements for the past 2 months: Single Family Home                                       Social Determinants of Health (SDOH) Interventions    Readmission Risk Interventions No flowsheet data found.

## 2021-09-27 ENCOUNTER — Inpatient Hospital Stay: Payer: Medicare Other

## 2021-09-27 DIAGNOSIS — A419 Sepsis, unspecified organism: Secondary | ICD-10-CM | POA: Diagnosis not present

## 2021-09-27 DIAGNOSIS — N390 Urinary tract infection, site not specified: Secondary | ICD-10-CM | POA: Diagnosis not present

## 2021-09-27 DIAGNOSIS — E870 Hyperosmolality and hypernatremia: Secondary | ICD-10-CM | POA: Diagnosis not present

## 2021-09-27 DIAGNOSIS — Z7189 Other specified counseling: Secondary | ICD-10-CM | POA: Diagnosis not present

## 2021-09-27 DIAGNOSIS — L8990 Pressure ulcer of unspecified site, unspecified stage: Secondary | ICD-10-CM | POA: Insufficient documentation

## 2021-09-27 DIAGNOSIS — L304 Erythema intertrigo: Secondary | ICD-10-CM | POA: Diagnosis present

## 2021-09-27 DIAGNOSIS — R339 Retention of urine, unspecified: Secondary | ICD-10-CM | POA: Diagnosis present

## 2021-09-27 DIAGNOSIS — R509 Fever, unspecified: Secondary | ICD-10-CM | POA: Diagnosis not present

## 2021-09-27 DIAGNOSIS — I4729 Other ventricular tachycardia: Secondary | ICD-10-CM | POA: Clinically undetermined

## 2021-09-27 LAB — CBC
HCT: 35.8 % — ABNORMAL LOW (ref 36.0–46.0)
Hemoglobin: 11.4 g/dL — ABNORMAL LOW (ref 12.0–15.0)
MCH: 30.3 pg (ref 26.0–34.0)
MCHC: 31.8 g/dL (ref 30.0–36.0)
MCV: 95.2 fL (ref 80.0–100.0)
Platelets: 390 10*3/uL (ref 150–400)
RBC: 3.76 MIL/uL — ABNORMAL LOW (ref 3.87–5.11)
RDW: 15.7 % — ABNORMAL HIGH (ref 11.5–15.5)
WBC: 17.7 10*3/uL — ABNORMAL HIGH (ref 4.0–10.5)
nRBC: 0 % (ref 0.0–0.2)

## 2021-09-27 LAB — BASIC METABOLIC PANEL
Anion gap: 6 (ref 5–15)
BUN: 37 mg/dL — ABNORMAL HIGH (ref 8–23)
CO2: 28 mmol/L (ref 22–32)
Calcium: 9.4 mg/dL (ref 8.9–10.3)
Chloride: 116 mmol/L — ABNORMAL HIGH (ref 98–111)
Creatinine, Ser: 1.1 mg/dL — ABNORMAL HIGH (ref 0.44–1.00)
GFR, Estimated: 48 mL/min — ABNORMAL LOW (ref >60)
Glucose, Bld: 135 mg/dL — ABNORMAL HIGH (ref 70–99)
Potassium: 4.6 mmol/L (ref 3.5–5.1)
Sodium: 150 mmol/L — ABNORMAL HIGH (ref 135–145)

## 2021-09-27 LAB — LACTIC ACID, PLASMA: Lactic Acid, Venous: 0.8 mmol/L (ref 0.5–1.9)

## 2021-09-27 MED ORDER — DEXTROSE 5 % IV SOLN: INTRAVENOUS | Status: DC

## 2021-09-27 MED ORDER — GADOBUTROL 1 MMOL/ML IV SOLN
7.0000 mL | Freq: Once | INTRAVENOUS | Status: AC | PRN
  Administered 2021-09-27: 7 mL via INTRAVENOUS

## 2021-09-27 MED ORDER — METHOCARBAMOL 1000 MG/10ML IJ SOLN
500.0000 mg | Freq: Three times a day (TID) | INTRAVENOUS | Status: DC | PRN
  Administered 2021-09-27 – 2021-09-30 (×4): 500 mg via INTRAVENOUS
  Filled 2021-09-27 (×2): qty 5
  Filled 2021-09-27: qty 500
  Filled 2021-09-27: qty 5

## 2021-09-27 MED ORDER — NYSTATIN 100000 UNIT/GM EX POWD
Freq: Two times a day (BID) | CUTANEOUS | Status: DC
  Filled 2021-09-27: qty 15

## 2021-09-27 MED ORDER — LORAZEPAM 2 MG/ML IJ SOLN
0.5000 mg | Freq: Once | INTRAMUSCULAR | Status: AC | PRN
  Administered 2021-09-27: 0.5 mg via INTRAVENOUS
  Filled 2021-09-27: qty 1

## 2021-09-27 MED ORDER — VANCOMYCIN HCL 1250 MG/250ML IV SOLN
1250.0000 mg | INTRAVENOUS | Status: DC
  Filled 2021-09-27: qty 250

## 2021-09-27 MED ORDER — KETOROLAC TROMETHAMINE 30 MG/ML IJ SOLN
30.0000 mg | Freq: Once | INTRAMUSCULAR | Status: AC
  Administered 2021-09-28: 30 mg via INTRAVENOUS
  Filled 2021-09-27: qty 1

## 2021-09-27 MED ORDER — CHLORHEXIDINE GLUCONATE CLOTH 2 % EX PADS
6.0000 | MEDICATED_PAD | Freq: Every day | CUTANEOUS | Status: DC
  Administered 2021-09-27 – 2021-10-02 (×6): 6 via TOPICAL

## 2021-09-27 MED ORDER — SODIUM CHLORIDE 0.9 % IV SOLN: INTRAVENOUS | Status: DC | PRN

## 2021-09-27 MED ORDER — SODIUM CHLORIDE 0.9 % IV SOLN
2.0000 g | Freq: Two times a day (BID) | INTRAVENOUS | Status: AC
  Administered 2021-09-27 – 2021-09-30 (×7): 2 g via INTRAVENOUS
  Filled 2021-09-27: qty 40
  Filled 2021-09-27: qty 20
  Filled 2021-09-27 (×7): qty 40

## 2021-09-27 MED ORDER — VANCOMYCIN HCL 1500 MG/300ML IV SOLN
1500.0000 mg | Freq: Once | INTRAVENOUS | Status: AC
  Administered 2021-09-27: 1500 mg via INTRAVENOUS
  Filled 2021-09-27 (×2): qty 300

## 2021-09-27 MED ORDER — DILTIAZEM HCL ER COATED BEADS 120 MG PO CP24
120.0000 mg | ORAL_CAPSULE | Freq: Every day | ORAL | Status: DC
  Administered 2021-09-27 – 2021-10-01 (×5): 120 mg via ORAL
  Filled 2021-09-27 (×5): qty 1

## 2021-09-27 NOTE — Progress Notes (Signed)
Patient retaining urin. NP made aware orders for I&O cath twice during the shift.

## 2021-09-27 NOTE — Progress Notes (Signed)
Dr Arbutus Ped notified of yeast like appearance under breast and a stage 3 to sacrum. Order received for nystatin

## 2021-09-27 NOTE — Assessment & Plan Note (Addendum)
Resolved with D5w started on 2/10. Sodium 144 this morning.  Patient has been lethargic and mostly sleeping, very limited p.o. intake -- On D5w with KCl additive  -- Encourage oral hydration -- Monitor BMP

## 2021-09-27 NOTE — Progress Notes (Signed)
Physical Therapy Treatment Patient Details Name: Stacy Watts MRN: [id number] DOB: 1931-07-10 Today's Date: 09/27/2021   History of Present Illness 88 y.o. female who presented to [facility] 2/6 with back pain and increasing confusion. She was found to have severe sepsis secondary to acute UTI complicated by AKI and acute metabolic encephalopathy. PMH significant for: HTN, vaginal/rectal prolapse, CKD stage IIIa, dementia, OSA    PT Comments    Pt resting in bed upon PT arrival; pt's family present; pt and pt's family agreeable to therapy session.  PT/OT co-treatment performed.  Pt lethargic during session requiring consistent cueing to open eyes and participate with sessions activities; myoclonic jerks also noted (MD present during session and aware).  Total assist x2 semi-supine to/from sitting edge of bed (using bed sheet); pt able to sit on edge of bed for 14 minutes total with min to mod assist for sitting balance plus 2nd assist for safety (able to briefly sit with close SBA x2).  Able to perform B LE LAQ's x10 reps with consistent cueing and participated with OT activities (pt requiring consistent cueing to open eyes and stay alert).  No c/o pain noted beginning of session or with sitting activities but pt did vocalize pain when repositioned back in bed end of session.  Will continue to focus on strengthening, sitting balance, and progressive functional mobility per pt tolerance.   Recommendations for follow up therapy are one component of a multi-disciplinary discharge planning process, led by the attending physician.  Recommendations may be updated based on patient status, additional functional criteria and insurance authorization.  Follow Up Recommendations  Skilled nursing-short term rehab (<3 hours/day)     Assistance Recommended at Discharge Frequent or constant Supervision/Assistance  Patient can return home with the following Assistance with cooking/housework;Direct  supervision/assist for financial management;Assist for transportation;Two people to help with walking and/or transfers;Two people to help with bathing/dressing/bathroom;Direct supervision/assist for medications management;Help with stairs or ramp for entrance   Equipment Recommendations  Wheelchair (measurements PT);Wheelchair cushion (measurements PT);Hospital bed;Other (comment) (hoyer lift)    Recommendations for Other Services OT consult     Precautions / Restrictions Precautions Precautions: Fall Precaution Comments: Aspiration Restrictions Weight Bearing Restrictions: No     Mobility  Bed Mobility Overal bed mobility: Needs Assistance Bed Mobility: Supine to Sit, Sit to Supine Rolling: Max assist, +2 for physical assistance   Supine to sit: +2 for physical assistance, Total assist (use of bed sheet) Sit to supine: +2 for physical assistance, Total assist   General bed mobility comments: +2 total assist (using bed sheet) semi-supine to/from sitting    Transfers                   General transfer comment: not able to assess at this time    Ambulation/Gait               General Gait Details: not able to assess at this time   Stairs             Wheelchair Mobility    Modified Rankin (Stroke Patients Only)       Balance Overall balance assessment: Needs assistance Sitting-balance support: Feet supported, Bilateral upper extremity supported Sitting balance-Leahy Scale: Fair Sitting balance - Comments: fluctuating between briefly close SBA (x2) to min to mod assist  Cognition Arousal/Alertness: Lethargic Behavior During Therapy: Anxious Overall Cognitive Status: Difficult to assess                                 General Comments: Pt able to state name and month/day of birthdate; follows less than 50% or commands        Exercises General Exercises - Lower Extremity Long  Arc Quad: AROM, Strengthening, Both, 10 reps, Seated (consistent vc's for exercise required)    General Comments General comments (skin integrity, edema, etc.): R>L LE swelling noted      Pertinent Vitals/Pain Pain Assessment Pain Assessment: Faces Faces Pain Scale: Hurts even more Pain Location: back, sacrum Pain Descriptors / Indicators: Guarding, Grimacing Pain Intervention(s): Limited activity within patient's tolerance, Monitored during session, Repositioned Vitals (HR and O2 on room air) stable and WFL throughout treatment session.    Home Living                          Prior Function            PT Goals (current goals can now be found in the care plan section) Acute Rehab PT Goals Patient Stated Goal: to improve pain and mobility PT Goal Formulation: With patient/family Time For Goal Achievement: 10/08/21 Potential to Achieve Goals: Fair Progress towards PT goals: Progressing toward goals    Frequency    Min 2X/week      PT Plan Current plan remains appropriate    Co-evaluation PT/OT/SLP Co-Evaluation/Treatment: Yes Reason for Co-Treatment: Necessary to address cognition/behavior during functional activity;For patient/therapist safety;To address functional/ADL transfers PT goals addressed during session: Mobility/safety with mobility OT goals addressed during session: ADL's and self-care      AM-PAC PT "6 Clicks" Mobility   Outcome Measure  Help needed turning from your back to your side while in a flat bed without using bedrails?: Total Help needed moving from lying on your back to sitting on the side of a flat bed without using bedrails?: Total Help needed moving to and from a bed to a chair (including a wheelchair)?: Total Help needed standing up from a chair using your arms (e.g., wheelchair or bedside chair)?: Total Help needed to walk in hospital room?: Total Help needed climbing 3-5 steps with a railing? : Total 6 Click Score: 6     End of Session Equipment Utilized During Treatment: Oxygen Activity Tolerance: Patient limited by fatigue;Patient limited by lethargy Patient left: in bed;with call bell/phone within reach;with bed alarm set;with nursing/sitter in room;with family/visitor present;Other (comment) (B heels floating via pillow support) Nurse Communication: Mobility status;Precautions;Other (comment) (pt's level of alertness) PT Visit Diagnosis: Other abnormalities of gait and mobility (R26.89);Muscle weakness (generalized) (M62.81);History of falling (Z91.81);Pain Pain - part of body:  (mid low back)     Time: [id number] PT Time Calculation (min) (ACUTE ONLY): 31 min  Charges:  $Therapeutic Activity: 8-22 mins                    Leitha Bleak, PT 09/27/21, 3:40 PM

## 2021-09-27 NOTE — Assessment & Plan Note (Addendum)
Pt spiked fever 101.1 F despite abx for UTI.  MRI of T/L spine showed osteo/discitis and epidural abscess.   Fever resolved after broadened abx.  Abx per ID. Monitor fever curve and CBC.

## 2021-09-27 NOTE — Progress Notes (Signed)
[employer] from [facility] radiology regarding Lumbar MRI 1. Discitis/osteomyelitis at L1-2 with ventral epidural abscess at L1 compressing the thecal sac. Associated right psoas abscess at the level of L2, measuring up to 14 mm. 2. Advanced lumbar spine degeneration with scoliosis and neural impingement as described.  Discussed with Dr Adriana Simas with neurosurgery who will see patient and talk to family 2/11. Poor surgical candidate with high mortality/morbidity risk Discussed findings with daughter at bedside Antibiotics changed to vanc and meropenem as she has pseudomonas risk.  Infectious disease also consulted  Nursing also informs me that patient has small open  area in sacral cleft that does have tome depth.  Wound care consult

## 2021-09-27 NOTE — Progress Notes (Signed)
Occupational Therapy Treatment Patient Details Name: Stacy Watts MRN: [id number] DOB: Jun 24, 1931 Today's Date: 09/27/2021   History of present illness 88 y.o. female who presented to [facility] 2/6 with back pain and increasing confusion. She was found to have severe sepsis secondary to acute UTI complicated by AKI and acute metabolic encephalopathy. PMH significant for: HTN, vaginal/rectal prolapse, CKD stage IIIa, dementia, OSA   OT comments  Stacy Watts was seen for OT/Pt co-treatment on this date. Upon arrival to room pt and family at bedside, agreeable to tx with PT.  Pt requires TOTAL A for bed mobility. MAX A hand over hand face washing seated EOB, +2 asssit for balance. Pt noted to have myoclonic jerks with eyes closing frequently - MD in room to assess. MAX A don B socks seated EOB. Pt toelrates ~15 min static and dynamic sitting. Pt making progress toward goals. Pt continues to benefit from skilled OT services to maximize return to PLOF and minimize risk of future falls, injury, caregiver burden, and readmission. Will continue to follow POC. Discharge recommendation remains appropriate.     Recommendations for follow up therapy are one component of a multi-disciplinary discharge planning process, led by the attending physician.  Recommendations may be updated based on patient status, additional functional criteria and insurance authorization.    Follow Up Recommendations  Skilled nursing-short term rehab (<3 hours/day)    Assistance Recommended at Discharge Frequent or constant Supervision/Assistance  Patient can return home with the following  A lot of help with walking and/or transfers;A lot of help with bathing/dressing/bathroom   Equipment Recommendations  Other (comment) (defer)    Recommendations for Other Services      Precautions / Restrictions Precautions Precautions: Fall Precaution Comments: Aspiration Restrictions Weight Bearing Restrictions: No        Mobility Bed Mobility Overal bed mobility: Needs Assistance Bed Mobility: Supine to Sit, Sit to Supine     Supine to sit: +2 for physical assistance, Total assist Sit to supine: +2 for physical assistance, Total assist                             Balance Overall balance assessment: Needs assistance Sitting-balance support: Feet supported, Bilateral upper extremity supported Sitting balance-Leahy Scale: Fair                                     ADL either performed or assessed with clinical judgement   ADL Overall ADL's : Needs assistance/impaired                                       General ADL Comments: MAX A hand over hand face washing seated EOB, +2 asssit for balance. Pt intermittently jerking t/o with eyes closing frequently. MAX A don B socks seated EOB. Pt toelrates ~15 min static and dynamic sitting.      Cognition Arousal/Alertness: Lethargic Behavior During Therapy: Anxious Overall Cognitive Status: Difficult to assess                                 General Comments: states name/birthdate. follows less htan 50% of commands  Pertinent Vitals/ Pain       Pain Assessment Pain Assessment: Faces Faces Pain Scale: Hurts even more Pain Location: back, sacrum Pain Descriptors / Indicators: Guarding, Grimacing Pain Intervention(s): Limited activity within patient's tolerance, Repositioned   Frequency  Min 2X/week        Progress Toward Goals  OT Goals(current goals can now be found in the care plan section)  Progress towards OT goals: Progressing toward goals  Acute Rehab OT Goals OT Goal Formulation: With family Time For Goal Achievement: 10/09/21 Potential to Achieve Goals: Fair ADL Goals Pt Will Perform Grooming: with min assist;bed level Pt Will Perform Upper Body Dressing: with min assist;sitting Pt Will Perform Lower Body Dressing: with mod assist;sitting/lateral  leans  Plan Discharge plan remains appropriate;Frequency remains appropriate    Co-evaluation    PT/OT/SLP Co-Evaluation/Treatment: Yes Reason for Co-Treatment: Necessary to address cognition/behavior during functional activity;For patient/therapist safety;To address functional/ADL transfers PT goals addressed during session: Mobility/safety with mobility OT goals addressed during session: ADL's and self-care      AM-PAC OT "6 Clicks" Daily Activity     Outcome Measure   Help from another person eating meals?: A Lot Help from another person taking care of personal grooming?: A Lot Help from another person toileting, which includes using toliet, bedpan, or urinal?: Total Help from another person bathing (including washing, rinsing, drying)?: Total Help from another person to put on and taking off regular upper body clothing?: A Lot Help from another person to put on and taking off regular lower body clothing?: Total 6 Click Score: 9    End of Session Equipment Utilized During Treatment: Oxygen  OT Visit Diagnosis: Muscle weakness (generalized) (M62.81);Pain   Activity Tolerance Patient tolerated treatment well;Patient limited by lethargy   Patient Left in bed;with call bell/phone within reach;with bed alarm set;with family/visitor present   Nurse Communication Mobility status        Time: [id number] OT Time Calculation (min): 27 min  Charges: OT General Charges $OT Visit: 1 Visit OT Treatments $Self Care/Home Management : 8-22 mins  Dessie Coma, M.S. OTR/L  09/27/21, 2:32 PM  ascom [phone]

## 2021-09-27 NOTE — Assessment & Plan Note (Signed)
Patient has erythema under bilateral breasts, appears consistent with candidal intertrigo. -- Nystatin powder -- Monitor closely

## 2021-09-27 NOTE — Assessment & Plan Note (Signed)
Present on admission.  I agree with the wound description as outlined below. -- Frequently reposition patient -- Local wound care --Monitor closely for signs of infection  Pressure Injury 09/27/21 Sacrum Mid Stage 3 -  Full thickness tissue loss. Subcutaneous fat may be visible but bone, tendon or muscle are NOT exposed. hole (Active)  09/27/21 1033  Location: Sacrum  Location Orientation: Mid  Staging: Stage 3 -  Full thickness tissue loss. Subcutaneous fat may be visible but bone, tendon or muscle are NOT exposed.  Wound Description (Comments): hole  Present on Admission:

## 2021-09-27 NOTE — Progress Notes (Signed)
CCMD reported that the patient has a 9 beat run of v-tac at 12:52pm. Dr Arbutus Ped notified

## 2021-09-27 NOTE — Assessment & Plan Note (Addendum)
Foley placed on admission was d/c'd for voiding trial, failed.  She has structural issues related to pelvic organ prolapse causing urinary retention. --Replaced Foley --Likely will need chronic Foley until prolapse issues can be addressed

## 2021-09-27 NOTE — Assessment & Plan Note (Addendum)
Central tele reported 9 beat run of VT on 2/10 at 1252.  Pt was reportedly sleeping at the time.   --Telemetry  --Monitor and replace K and Mg

## 2021-09-27 NOTE — Progress Notes (Addendum)
Progress Note   Patient: Stacy Watts [id number] DOB: Jan 06, 1931 DOA: 09/23/2021     4 DOS: the patient was seen and examined on 09/27/2021   Brief hospital course: MALIKIA BERGS is a 88 y.o. female with medical history significant for CKD stage IIIa, hypertension, dementia, OSA. She was brought to the hospital because of back pain and increasing confusion.  She was recently treated for UTI with a 3-day course of Bactrim (started Bactrim on 09/19/2021).    She was found to have severe sepsis secondary to acute UTI complicated by AKI and acute metabolic encephalopathy.  She was treated with IV fluids and empiric IV antibiotics.  Assessment and Plan: * Sepsis secondary to UTI [facility])- (present on admission) Presented with leukocytosis, tachycardia, AKI and lactic acidosis consistent with organ dysfunction.  Treated per protocol on admission.  Continue IV antibiotics and follow cultures.  UTI (urinary tract infection)- (present on admission) Due to anatomical urinary retention in the setting of vaginal and rectal prolapse. -- Foley discontinued yesterday afternoon.  Patient required multiple in/out caths for ongoing retention --Replace Foley -- Continue empiric cefepime  -- Off IV fluids   Back pain- (present on admission) Patient appears to be having acute on chronic back pain.  She is a poor historian however.  Family reported episodes and I witnessed episodes of patient suddenly wincing in pain briefly.  She also complains of leg pain but unable to characterize this. Clinically suspect lumbar etiology with spasms and possibly radicular component. --Robaxin as needed --Prednisone for anti-inflammatory given NSAIDs contraindicated with renal failure -- Tylenol as needed --Lumbar and thoracic spine MRIs --Did try on low-dose gabapentin with some apparent improvement in her pain, however too sedating for the patient, she has been more lethargic and is also having myoclonic jerks.    --Stop gabapentin -- PT/OT  Hyponatremia- (present on admission) Resolved.  Present on admission, acute.   Likely hypovolemic in the setting of poor p.o. intake.  Presented with sodium 129, improved with IV fluids.  Monitor BMP.  AKI (acute kidney injury) ([place])- (present on admission) Presented with creatinine 3.45, baseline appears around 1.2.  Foley was placed on admission due to obstructive uropathy secondary to vaginal and rectal prolapse -- Off IV fluids  -- Foley discontinued yesterday afternoon, patient had recurrent urinary retention and required multiple and out caths --Replace Foley Creatinine further improved today [id number]  Acute metabolic encephalopathy- (present on admission) Present on admission likely due to infection with underlying dementia or cognitive impairment.  Continue treating infection as outlined.  Supportive care.  Delirium precautions. Patient seems to have hypoactive delirium, mostly lethargic and sleeping.  Bilateral hydronephrosis- (present on admission) Due to pelvic floor laxity causing vaginal and rectal prolapse.   Voiding trial attempted, still with ongoing retention.   Foley being replaced today Outpatient follow-up with urology and/or gynecology.   HTN (hypertension)- (present on admission) Blood pressures have been elevated, possibly related to pain. Continue torsemide  As needed hydralazine  Added diltiazem 120 mg daily Titrate antihypertensives  OSA (obstructive sleep apnea)- (present on admission) CPAP ordered   Intertrigo- (present on admission) Patient has erythema under bilateral breasts, appears consistent with candidal intertrigo. -- Nystatin powder -- Monitor closely  NSVT (nonsustained ventricular tachycardia) Central tele reported 9 beat run of VT today at 1252.  Pt was reportedly sleeping at the time.   --Telemetry  --Monitor and replace K and Mg  Pressure injury of skin Present on admission.  I agree with the wound  description as outlined below. -- Frequently reposition patient -- Local wound care --Monitor closely for signs of infection  Pressure Injury 09/27/21 Sacrum Mid Stage 3 -  Full thickness tissue loss. Subcutaneous fat may be visible but bone, tendon or muscle are NOT exposed. hole (Active)  09/27/21 1033  Location: Sacrum  Location Orientation: Mid  Staging: Stage 3 -  Full thickness tissue loss. Subcutaneous fat may be visible but bone, tendon or muscle are NOT exposed.  Wound Description (Comments): hole  Present on Admission:         Urinary retention- (present on admission) Foley placed on admission was d/c'd yesterday afternoon.  Pt still retaining urine, required multiple in/out caths. She has structural issues related to pelvic organ prolapse causing urinary retention. --Replace Foley --Likely will need chronic Foley until prolapse issues can be addressed  Hypernatremia Sodium 150 this morning.  Patient has been lethargic and mostly sleeping, not taking in enough water. -- Start D5W -- Encourage oral hydration -- Monitor BMP  Fever Patient spiked fever 101.6F overnight.  Possibly due to recurrent urinary retention.  No other obvious symptoms aside from ongoing acute back pain without apparent injury or trauma. -- Chest x-ray and blood culture -- MRI lumbar and thoracic spine -- Continue cefepime without any changes to antibiotics for now  CKD (chronic kidney disease) stage 3, GFR 30-59 ml/min (HCC) Baseline creatinine appears around 1.2.  Presented with AKI as outlined. Monitor renal function. Creatinine today 1.10, improving        Subjective: Patient seen with daughter and granddaughter at bedside today.  She was working with physical and Occupational Therapy, seated edge of bed at the time.  She was sitting up for about 12 minutes but very lethargic and can barely keep her eyes open.  When she wakes up she follows commands and denies any complaints.  She was  not appearing to have pain when seated edge of bed but did yell out in pain when she was being repositioned in the bed.  Family seem to think her back pain is improved with gabapentin but she is more loose sedated and lethargic.  Physical Exam: Vitals:   09/26/21 1957 09/26/21 2134 09/27/21 0428 09/27/21 0721  BP: (!) 156/69  (!) 167/80 (!) 174/80  Pulse: 94  85 88  Resp: 20  16 20   Temp: (!) 101.1 F (38.4 C) 99.8 F (37.7 C) 99.5 F (37.5 C) 99.2 F (37.3 C)  TempSrc: Oral Axillary  Axillary  SpO2: 93%  95%   Weight:      Height:       General exam: Lethargic, no acute distress, obese HEENT: moist mucus membranes, hearing grossly normal  Respiratory system: CTAB diminished bases, no wheezes, rales or rhonchi, normal respiratory effort. Cardiovascular system: normal S1/S2, RRR, stable lower extremity nonpitting edema.   Central nervous system: Grip strength is symmetric bilaterally but with poor effort due to lethargy, no gross focal neurologic deficits Extremities: moves all, no cyanosis, normal tone Skin: dry, intact, normal temperature, No rashes, lesions or ulcers seen on visualized skin Psychiatry: normal mood, congruent affect, unable to assess judgment and insight at this time due to lethargy   Data Reviewed:  Labs reviewed and notable for sodium 150, chloride 116, glucose 135, BUN 37, creatinine 1.10, WBC 17.7 downtrending, hemoglobin stable 11.4  Family Communication: Daughter and granddaughter at bedside on rounds.  Other daughter was on speaker phone during the encounter.  Disposition: Status is: Inpatient Remains inpatient appropriate because: Severity  of illness with fevers, uncontrolled back pain with evaluation underway, ongoing urinary retention     Planned Discharge Destination: Skilled nursing facility     Time spent: 50 minutes with greater than 50% spent at bedside and in coordinating care  Author: Ezekiel Slocumb, DO 09/27/2021 2:10 PM  For on  call review [email or web address].

## 2021-09-27 NOTE — TOC Progression Note (Signed)
Transition of Care [facility]) - Progression Note    Patient Details  Name: Stacy Watts MRN: [id number] Date of Birth: 08-04-1931  [facility]) CM/SW [place], LCSW Phone Number: 09/27/2021, 9:23 AM  Clinical Narrative:   Insurance authorization approved. Auth number has not generated yet. Reference # [id number]. Valid 2/10-2/14.   Expected Discharge Plan: [place] Barriers to Discharge: Continued Medical Work up  Expected Discharge Plan and Services Expected Discharge Plan: [place]   Discharge Planning Services: CM Consult   Living arrangements for the past 2 months: Single Family Home                                       Social Determinants of Health (SDOH) Interventions    Readmission Risk Interventions No flowsheet data found.

## 2021-09-27 NOTE — Consult Note (Signed)
Pharmacy Antibiotic Note  Stacy Watts is a 88 y.o. female admitted on 09/23/2021 with discitis, osteomyelitis and epidural abscess lumbar spine. Pharmacy has been consulted for vancomycin and meropenem dosing.  Plan: Vancomycin 1500 mg IV loading dose, followed by 1250 mg IV q48h Goal AUC 400-550  Est AUC: 509.5 Est Cmax: 36 Est Cmin: 11.6 Calculated with SCr 1.1  Meropenem 2 g IV q12h per indication and renal function   Monitor clinical picture, renal function, and vancomycin levels at steady state F/U C&S, abx deescalation / LOT   Height: 4\' 11"  (149.9 cm) Weight: 69.8 kg (153 lb 14.1 oz) IBW/kg (Calculated) : 43.2  Temp (24hrs), Avg:99.1 F (37.3 C), Min:97.7 F (36.5 C), Max:99.8 F (37.7 C)  Recent Labs  Lab 09/23/21 1640 09/23/21 1836 09/23/21 2212 09/24/21 0451 09/25/21 0357 09/26/21 0359 09/27/21 0454  WBC 31.0*  --   --   --  24.1* 20.4* 17.7*  CREATININE 3.45*  --   --  2.98* 2.10* 1.32* 1.10*  LATICACIDVEN  --  1.4 2.3*  --   --   --  0.8    Estimated Creatinine Clearance: 30 mL/min (A) (by C-G formula based on SCr of 1.1 mg/dL (H)).    Allergies  Allergen Reactions   Amlodipine     Other reaction(s): Dizziness   Cefdinir     Other reaction(s): Unknown   Ciprofloxacin     Other reaction(s): Unknown   Losartan     Other reaction(s): Angioedema   Nitrofurantoin Swelling   Other     UNKNOWN BLOOD PRESSURE MEDICATION   Carvedilol Rash   Clonidine Rash and Swelling    Antimicrobials this admission: 2/6 vancomycin x 1  2/6 flagyl >> 1/7 2/6 cefepime >> 2/10 2/10 vancomycin >>   Dose adjustments this admission:   Microbiology results: 2/10 BCx: pending 2/6 BCx: NG 2/6 UCx: <10K colonies insignificant growth    Thank you for allowing pharmacy to be a part of this patients care.  Darnelle Bos, PharmD 09/27/2021 8:21 PM

## 2021-09-27 NOTE — Consult Note (Signed)
Consultation Note Date: 09/27/2021   Patient Name: Stacy Watts  DOB: 09-19-1930  MRN: [id number]  Age / Sex: 88 y.o., female  PCP: Kirk Ruths, MD Referring Physician: Ezekiel Slocumb, DO  Reason for Consultation: Establishing goals of care  HPI/Patient Profile: Stacy Watts is a 88 y.o. female with medical history significant for CKD stage IIIa, hypertension, dementia, OSA. She was brought to the hospital because of back pain and increasing confusion.  She was recently treated for UTI with a 3-day course of Bactrim (started Bactrim on 09/19/2021).   Clinical Assessment and Goals of Care: Patient is resting in bed with daughter at bedside. Patient has 3 of 5 children living, the two who died died within 6 months of each other from heart failure.   She and one of her sons have lived together since the 1990's when patient was widowed. She uses a walker. She requires assistance with bathing. She no longer drives.   Broached GOC. She states they have never discussed GOC or advanced directives. She is hopeful she will be able to talk to her mother over the next couple of days about her wishes.      SUMMARY OF RECOMMENDATIONS   Daughter is hopeful she will be able to talk to patient about her wishes this weekend.        Primary Diagnoses: Present on Admission:  UTI (urinary tract infection)  Bilateral hydronephrosis  OSA (obstructive sleep apnea)  HTN (hypertension)  AKI (acute kidney injury) ([place])  Hyponatremia  Sepsis secondary to UTI ([place])  Acute metabolic encephalopathy  Back pain  Urinary retention  Intertrigo   I have reviewed the medical record, interviewed the patient and family, and examined the patient. The following aspects are pertinent.  Past Medical History:  Diagnosis Date   Hypertension    Social History   Socioeconomic History   Marital status:  Widowed    Spouse name: Not on file   Number of children: Not on file   Years of education: Not on file   Highest education level: Not on file  Occupational History   Not on file  Tobacco Use   Smoking status: Never   Smokeless tobacco: Never  Substance and Sexual Activity   Alcohol use: No   Drug use: Not on file   Sexual activity: Not on file  Other Topics Concern   Not on file  Social History Narrative   Not on file   Social Determinants of Health   Financial Resource Strain: Not on file  Food Insecurity: Not on file  Transportation Needs: Not on file  Physical Activity: Not on file  Stress: Not on file  Social Connections: Not on file   History reviewed. No pertinent family history. Scheduled Meds:  Chlorhexidine Gluconate Cloth  6 each Topical Daily   diltiazem  120 mg Oral Daily   heparin  5,000 Units Subcutaneous Q8H   lidocaine  1 patch Transdermal Q24H   nystatin   Topical BID   polyethylene glycol  17 g Oral Daily   predniSONE  20 mg Oral Q breakfast   senna-docusate  1 tablet Oral QHS   torsemide  20 mg Oral BID   Continuous Infusions:  ceFEPime (MAXIPIME) IV Stopped (09/27/21 1259)   dextrose 75 mL/hr at 09/27/21 1504   PRN Meds:.acetaminophen, hydrALAZINE, LORazepam, methocarbamol Medications Prior to Admission:  Prior to Admission medications   Medication Sig Start Date End Date Taking? Authorizing Provider  aspirin EC 81 MG tablet Take 81 mg by mouth daily.   Yes [provider]  cholecalciferol (VITAMIN D) 1000 units tablet Take 1,000 Units by mouth daily.   Yes [provider]  potassium chloride (KLOR-CON) 10 MEQ tablet Take 10 mEq by mouth daily. 07/10/21  Yes [provider]  torsemide (DEMADEX) 20 MG tablet Take 20 mg by mouth 2 (two) times daily. 09/16/21  Yes [provider]  acetaminophen (TYLENOL) 500 MG tablet Take 500 mg by mouth every 6 (six) hours as needed.    [provider]   Allergies   Allergen Reactions   Amlodipine     Other reaction(s): Dizziness   Cefdinir     Other reaction(s): Unknown   Ciprofloxacin     Other reaction(s): Unknown   Losartan     Other reaction(s): Angioedema   Nitrofurantoin Swelling   Other     UNKNOWN BLOOD PRESSURE MEDICATION   Carvedilol Rash   Clonidine Rash and Swelling   Review of Systems  Unable to perform ROS  Physical Exam Constitutional:      Comments: Eyes closed.     Vital Signs: BP 122/79    Pulse 80    Temp 97.7 F (36.5 C) (Oral)    Resp 18    Ht 4\' 11"  (1.499 m)    Wt 69.8 kg    SpO2 93%    BMI 31.08 kg/m  Pain Scale: PAINAD   Pain Score: Asleep   SpO2: SpO2: 93 % O2 Device:SpO2: 93 % O2 Flow Rate: .O2 Flow Rate (L/min): 2 L/min  IO: Intake/output summary:  Intake/Output Summary (Last 24 hours) at 09/27/2021 1625 Last data filed at 09/27/2021 1504 Gross per 24 hour  Intake 810.19 ml  Output 2775 ml  Net -1964.81 ml    LBM: Last BM Date: 09/23/21 Baseline Weight: Weight: 72.6 kg Most recent weight: Weight: 69.8 kg        Time In: 4:00 Time Out: 4:30 Time Total: 30 min Greater than 50%  of this time was spent counseling and coordinating care related to the above assessment and plan.  Signed by: Asencion Gowda, NP   Please contact Palliative Medicine Team phone at [phone] for questions and concerns.  For individual provider: See Shea Evans

## 2021-09-28 DIAGNOSIS — N390 Urinary tract infection, site not specified: Secondary | ICD-10-CM | POA: Diagnosis not present

## 2021-09-28 DIAGNOSIS — A419 Sepsis, unspecified organism: Secondary | ICD-10-CM | POA: Diagnosis not present

## 2021-09-28 DIAGNOSIS — G061 Intraspinal abscess and granuloma: Secondary | ICD-10-CM | POA: Diagnosis present

## 2021-09-28 DIAGNOSIS — M4620 Osteomyelitis of vertebra, site unspecified: Secondary | ICD-10-CM | POA: Diagnosis present

## 2021-09-28 DIAGNOSIS — K6812 Psoas muscle abscess: Secondary | ICD-10-CM | POA: Diagnosis present

## 2021-09-28 LAB — CBC WITH DIFFERENTIAL/PLATELET
Abs Immature Granulocytes: 1.12 10*3/uL — ABNORMAL HIGH (ref 0.00–0.07)
Basophils Absolute: 0.1 10*3/uL (ref 0.0–0.1)
Basophils Relative: 0 %
Eosinophils Absolute: 0 10*3/uL (ref 0.0–0.5)
Eosinophils Relative: 0 %
HCT: 38 % (ref 36.0–46.0)
Hemoglobin: 12 g/dL (ref 12.0–15.0)
Immature Granulocytes: 5 %
Lymphocytes Relative: 6 %
Lymphs Abs: 1.3 10*3/uL (ref 0.7–4.0)
MCH: 30.2 pg (ref 26.0–34.0)
MCHC: 31.6 g/dL (ref 30.0–36.0)
MCV: 95.7 fL (ref 80.0–100.0)
Monocytes Absolute: 1.7 10*3/uL — ABNORMAL HIGH (ref 0.1–1.0)
Monocytes Relative: 8 %
Neutro Abs: 16.5 10*3/uL — ABNORMAL HIGH (ref 1.7–7.7)
Neutrophils Relative %: 81 %
Platelets: 404 10*3/uL — ABNORMAL HIGH (ref 150–400)
RBC: 3.97 MIL/uL (ref 3.87–5.11)
RDW: 15.5 % (ref 11.5–15.5)
WBC: 20.7 10*3/uL — ABNORMAL HIGH (ref 4.0–10.5)
nRBC: 0 % (ref 0.0–0.2)

## 2021-09-28 LAB — CULTURE, BLOOD (ROUTINE X 2)
Culture: NO GROWTH
Culture: NO GROWTH
Special Requests: ADEQUATE

## 2021-09-28 LAB — BASIC METABOLIC PANEL
Anion gap: 10 (ref 5–15)
BUN: 45 mg/dL — ABNORMAL HIGH (ref 8–23)
CO2: 29 mmol/L (ref 22–32)
Calcium: 9.3 mg/dL (ref 8.9–10.3)
Chloride: 109 mmol/L (ref 98–111)
Creatinine, Ser: 1.24 mg/dL — ABNORMAL HIGH (ref 0.44–1.00)
GFR, Estimated: 42 mL/min — ABNORMAL LOW (ref >60)
Glucose, Bld: 140 mg/dL — ABNORMAL HIGH (ref 70–99)
Potassium: 4 mmol/L (ref 3.5–5.1)
Sodium: 148 mmol/L — ABNORMAL HIGH (ref 135–145)

## 2021-09-28 LAB — APTT: aPTT: 37 seconds — ABNORMAL HIGH (ref 24–36)

## 2021-09-28 LAB — SEDIMENTATION RATE: Sed Rate: 52 mm/hr — ABNORMAL HIGH (ref 0–30)

## 2021-09-28 LAB — PROCALCITONIN: Procalcitonin: 7.44 ng/mL

## 2021-09-28 LAB — C-REACTIVE PROTEIN: CRP: 2.2 mg/dL — ABNORMAL HIGH (ref <1.0)

## 2021-09-28 LAB — GLUCOSE, CAPILLARY: Glucose-Capillary: 145 mg/dL — ABNORMAL HIGH (ref 70–99)

## 2021-09-28 LAB — PROTIME-INR
INR: 1.1 (ref 0.8–1.2)
Prothrombin Time: 14.1 seconds (ref 11.4–15.2)

## 2021-09-28 MED ORDER — ORAL CARE MOUTH RINSE
15.0000 mL | Freq: Two times a day (BID) | OROMUCOSAL | Status: DC
  Administered 2021-09-28 – 2021-10-03 (×10): 15 mL via OROMUCOSAL

## 2021-09-28 NOTE — Assessment & Plan Note (Signed)
Seen on MRI's of thoracic and lumbar spine 09/27/21.  Mgmt as outlined.

## 2021-09-28 NOTE — Consult Note (Signed)
Neurosurgery-New Consultation Evaluation 09/28/2021 Janis Sol [facility] [id number]  Identifying Statement: NELIDA MANDARINO is a 88 y.o. female from [place] [id number] with infection  Physician Requesting Consultation: Sharion Settler, NP  History of Present Illness: Ms Vanengen is here with concern for infection. She was treated as outpatient for UTI but had worsening back pain. She has been here since 2/6 and getting antibiotics but cultures of urine and blood here were negative. She ultimately got a MRI of the lumbar spine which did show concern for infection around the L1/2 disc space and epidurally.  She has now been placed on broad-spectrum antibiotics.  She was getting cefepime previously.  Today, she is appearing encephalopathic.  She has reportedly been this way for a couple of days.  She has worked with physical therapy during the hospitalization.  Today, she does follow commands to wiggle toes and lift legs.  She is full strength distally in the legs but does not participate with strength testing proximally.  She has at least 4 out of 5 with knee extension.  Her sensation is intact.  Most of the history today is obtained from her daughters.  Past Medical History:  Past Medical History:  Diagnosis Date   Hypertension     Social History: Social History   Socioeconomic History   Marital status: Widowed    Spouse name: Not on file   Number of children: Not on file   Years of education: Not on file   Highest education level: Not on file  Occupational History   Not on file  Tobacco Use   Smoking status: Never   Smokeless tobacco: Never  Substance and Sexual Activity   Alcohol use: No   Drug use: Not on file   Sexual activity: Not on file  Other Topics Concern   Not on file  Social History Narrative   Not on file   Social Determinants of Health   Financial Resource Strain: Not on file  Food Insecurity: Not on file  Transportation Needs: Not on file   Physical Activity: Not on file  Stress: Not on file  Social Connections: Not on file  Intimate Partner Violence: Not on file   Living arrangements (living alone, with partner): Lives with her son  Family History: History reviewed. No pertinent family history.  Review of Systems:  Review of Systems - General ROS: Negative Psychological ROS: Negative Ophthalmic ROS: Negative ENT ROS: Negative Hematological and Lymphatic ROS: Negative  Endocrine ROS: Negative Respiratory ROS: Negative Cardiovascular ROS: Negative Gastrointestinal ROS: Negative Genito-Urinary ROS: Negative Musculoskeletal ROS: Positive for back pain Neurological ROS: Negative Dermatological ROS: Negative  Physical Exam: BP 137/76 (BP Location: Left Arm)    Pulse 99    Temp 99.4 F (37.4 C) (Oral)    Resp 20    Ht _0  (1.499 m)    Wt 69.8 kg    SpO2 95%    BMI 31.08 kg/m  Body mass index is 31.08 kg/m. Body surface area is 1.7 meters squared. General appearance: Lying in bed supine, grimaces to pain, opens eyes to voice and does follow simple commands but is not participating in communication Ext: Edema noted in bilateral lower extremities  Neurologic exam:  Mental status: Arouses with voice but needs repetitive stimulation to awaken Motor:strength appears 5 out of 5 in plantar flexion and at least 4-5 in dorsiflexion, knee extension Sensory: intact to painful stimuli in lower extremities Gait: Not tested  Laboratory: Results for orders placed or performed during the  hospital encounter of 09/23/21  Resp Panel by RT-PCR (Flu A&B, Covid) Nasopharyngeal Swab   Specimen: Nasopharyngeal Swab; Nasopharyngeal(NP) swabs in vial transport medium  Result Value Ref Range   SARS Coronavirus 2 by RT PCR NEGATIVE NEGATIVE   Influenza A by PCR NEGATIVE NEGATIVE   Influenza B by PCR NEGATIVE NEGATIVE  Urine Culture   Specimen: Urine, Clean Catch  Result Value Ref Range   Specimen Description      URINE, CLEAN  CATCH Performed at [facility], [address], [place], [place] [address]    Special Requests      NONE Performed at [facility], [address] [place], [place] [address]    Culture (A)     <10,000 COLONIES/mL INSIGNIFICANT GROWTH Performed at [facility] [address], [place], [place] [address]    Report Status 09/25/2021 FINAL   Blood culture (routine x 2)   Specimen: BLOOD  Result Value Ref Range   Specimen Description BLOOD RIGHT ANTECUBITAL    Special Requests      BOTTLES DRAWN AEROBIC AND ANAEROBIC Blood Culture adequate volume   Culture      NO GROWTH 5 DAYS Performed at [facility], [address] [place], [place] [address]    Report Status 09/28/2021 FINAL   Blood culture (routine x 2)   Specimen: BLOOD  Result Value Ref Range   Specimen Description BLOOD LEFT ANTECUBITAL    Special Requests      BOTTLES DRAWN AEROBIC AND ANAEROBIC Blood Culture results may not be optimal due to an inadequate volume of blood received in culture bottles   Culture      NO GROWTH 5 DAYS Performed at [facility], [address] [place], [place] [address]    Report Status 09/28/2021 FINAL   Culture, blood (single) w Reflex to ID Panel   Specimen: BLOOD  Result Value Ref Range   Specimen Description BLOOD BCAV    Special Requests      BOTTLES DRAWN AEROBIC AND ANAEROBIC BLOOD RIGHT HAND   Culture      NO GROWTH < 24 HOURS Performed at [facility], [address] [place], [place] [address]    Report Status PENDING   CBC with Differential  Result Value Ref Range   WBC 31.0 (H) 4.0 - 10.5 K/uL   RBC 4.36 3.87 - 5.11 MIL/uL   Hemoglobin 13.1 12.0 - 15.0 g/dL   HCT 39.7 36.0 - 46.0 %   MCV 91.1 80.0 - 100.0 fL   MCH 30.0 26.0 - 34.0 pg   MCHC 33.0 30.0 - 36.0 g/dL   RDW 14.8 11.5 - 15.5 %   Platelets 215 150 - 400 K/uL   nRBC 0.0 0.0 - 0.2 %   Neutrophils Relative % 77 %   Neutro Abs 23.7 (H) 1.7 - 7.7 K/uL    Lymphocytes Relative 5 %   Lymphs Abs 1.5 0.7 - 4.0 K/uL   Monocytes Relative 8 %   Monocytes Absolute 2.4 (H) 0.1 - 1.0 K/uL   Eosinophils Relative 1 %   Eosinophils Absolute 0.2 0.0 - 0.5 K/uL   Basophils Relative 0 %   Basophils Absolute 0.1 0.0 - 0.1 K/uL   Smear Review Normal platelet morphology    Immature Granulocytes 9 %   Abs Immature Granulocytes 2.75 (H) 0.00 - 0.07 K/uL   Burr Cells PRESENT   Comprehensive metabolic panel  Result Value Ref Range   Sodium 129 (L)  135 - 145 mmol/L   Potassium 4.3 3.5 - 5.1 mmol/L   Chloride 93 (L) 98 - 111 mmol/L   CO2 22 22 - 32 mmol/L   Glucose, Bld 92 70 - 99 mg/dL   BUN 97 (H) 8 - 23 mg/dL   Creatinine, Ser 3.45 (H) 0.44 - 1.00 mg/dL   Calcium 8.1 (L) 8.9 - 10.3 mg/dL   Total Protein 6.8 6.5 - 8.1 g/dL   Albumin 2.8 (L) 3.5 - 5.0 g/dL   AST 23 15 - 41 U/L   ALT 20 0 - 44 U/L   Alkaline Phosphatase 170 (H) 38 - 126 U/L   Total Bilirubin 0.4 0.3 - 1.2 mg/dL   GFR, Estimated 12 (L) >60 mL/min   Anion gap 14 5 - 15  Lipase, blood  Result Value Ref Range   Lipase 47 11 - 51 U/L  Brain natriuretic peptide  Result Value Ref Range   B Natriuretic Peptide 174.8 (H) 0.0 - 100.0 pg/mL  Urinalysis, Routine w reflex microscopic Urine, Catheterized  Result Value Ref Range   Color, Urine YELLOW YELLOW   APPearance CLEAR (A) CLEAR   Specific Gravity, Urine <1.005 (L) 1.005 - 1.030   pH 5.5 5.0 - 8.0   Glucose, UA NEGATIVE NEGATIVE mg/dL   Hgb urine dipstick TRACE (A) NEGATIVE   Bilirubin Urine NEGATIVE NEGATIVE   Ketones, ur NEGATIVE NEGATIVE mg/dL   Protein, ur NEGATIVE NEGATIVE mg/dL   Nitrite NEGATIVE NEGATIVE   Leukocytes,Ua SMALL (A) NEGATIVE   RBC / HPF 0-5 0 - 5 RBC/hpf   WBC, UA 0-5 0 - 5 WBC/hpf   Bacteria, UA RARE (A) NONE SEEN   Squamous Epithelial / LPF NONE SEEN 0 - 5   Mucus PRESENT   Lactic acid, plasma  Result Value Ref Range   Lactic Acid, Venous 1.4 0.5 - 1.9 mmol/L  Lactic acid, plasma  Result Value Ref  Range   Lactic Acid, Venous 2.3 (HH) 0.5 - 1.9 mmol/L  Creatinine, serum  Result Value Ref Range   Creatinine, Ser 2.98 (H) 0.44 - 1.00 mg/dL   GFR, Estimated 15 (L) >60 mL/min  Basic metabolic panel  Result Value Ref Range   Sodium 140 135 - 145 mmol/L   Potassium 4.1 3.5 - 5.1 mmol/L   Chloride 108 98 - 111 mmol/L   CO2 20 (L) 22 - 32 mmol/L   Glucose, Bld 116 (H) 70 - 99 mg/dL   BUN 70 (H) 8 - 23 mg/dL   Creatinine, Ser 2.10 (H) 0.44 - 1.00 mg/dL   Calcium 8.6 (L) 8.9 - 10.3 mg/dL   GFR, Estimated 22 (L) >60 mL/min   Anion gap 12 5 - 15  CBC with Differential/Platelet  Result Value Ref Range   WBC 24.1 (H) 4.0 - 10.5 K/uL   RBC 3.69 (L) 3.87 - 5.11 MIL/uL   Hemoglobin 11.2 (L) 12.0 - 15.0 g/dL   HCT 35.0 (L) 36.0 - 46.0 %   MCV 94.9 80.0 - 100.0 fL   MCH 30.4 26.0 - 34.0 pg   MCHC 32.0 30.0 - 36.0 g/dL   RDW 15.4 11.5 - 15.5 %   Platelets 256 150 - 400 K/uL   nRBC 0.0 0.0 - 0.2 %   Neutrophils Relative % 76 %   Neutro Abs 18.4 (H) 1.7 - 7.7 K/uL   Lymphocytes Relative 5 %   Lymphs Abs 1.2 0.7 - 4.0 K/uL   Monocytes Relative 7 %   Monocytes Absolute 1.7 (  H) 0.1 - 1.0 K/uL   Eosinophils Relative 0 %   Eosinophils Absolute 0.0 0.0 - 0.5 K/uL   Basophils Relative 1 %   Basophils Absolute 0.2 (H) 0.0 - 0.1 K/uL   WBC Morphology MILD LEFT SHIFT (1-5% METAS, OCC MYELO, OCC BANDS)    RBC Morphology MORPHOLOGY UNREMARKABLE    Smear Review Normal platelet morphology    Immature Granulocytes 11 %   Abs Immature Granulocytes 2.55 (H) 0.00 - 0.07 K/uL  Basic metabolic panel  Result Value Ref Range   Sodium 145 135 - 145 mmol/L   Potassium 4.6 3.5 - 5.1 mmol/L   Chloride 112 (H) 98 - 111 mmol/L   CO2 24 22 - 32 mmol/L   Glucose, Bld 136 (H) 70 - 99 mg/dL   BUN 45 (H) 8 - 23 mg/dL   Creatinine, Ser 1.32 (H) 0.44 - 1.00 mg/dL   Calcium 9.0 8.9 - 10.3 mg/dL   GFR, Estimated 39 (L) >60 mL/min   Anion gap 9 5 - 15  CBC  Result Value Ref Range   WBC 20.4 (H) 4.0 - 10.5 K/uL    RBC 3.78 (L) 3.87 - 5.11 MIL/uL   Hemoglobin 11.5 (L) 12.0 - 15.0 g/dL   HCT 35.6 (L) 36.0 - 46.0 %   MCV 94.2 80.0 - 100.0 fL   MCH 30.4 26.0 - 34.0 pg   MCHC 32.3 30.0 - 36.0 g/dL   RDW 15.5 11.5 - 15.5 %   Platelets 344 150 - 400 K/uL   nRBC 0.0 0.0 - 0.2 %  Magnesium  Result Value Ref Range   Magnesium 2.7 (H) 1.7 - 2.4 mg/dL  Glucose, capillary  Result Value Ref Range   Glucose-Capillary 179 (H) 70 - 99 mg/dL  Basic metabolic panel  Result Value Ref Range   Sodium 150 (H) 135 - 145 mmol/L   Potassium 4.6 3.5 - 5.1 mmol/L   Chloride 116 (H) 98 - 111 mmol/L   CO2 28 22 - 32 mmol/L   Glucose, Bld 135 (H) 70 - 99 mg/dL   BUN 37 (H) 8 - 23 mg/dL   Creatinine, Ser 1.10 (H) 0.44 - 1.00 mg/dL   Calcium 9.4 8.9 - 10.3 mg/dL   GFR, Estimated 48 (L) >60 mL/min   Anion gap 6 5 - 15  Lactic acid, plasma  Result Value Ref Range   Lactic Acid, Venous 0.8 0.5 - 1.9 mmol/L  CBC  Result Value Ref Range   WBC 17.7 (H) 4.0 - 10.5 K/uL   RBC 3.76 (L) 3.87 - 5.11 MIL/uL   Hemoglobin 11.4 (L) 12.0 - 15.0 g/dL   HCT 35.8 (L) 36.0 - 46.0 %   MCV 95.2 80.0 - 100.0 fL   MCH 30.3 26.0 - 34.0 pg   MCHC 31.8 30.0 - 36.0 g/dL   RDW 15.7 (H) 11.5 - 15.5 %   Platelets 390 150 - 400 K/uL   nRBC 0.0 0.0 - 0.2 %  Basic metabolic panel  Result Value Ref Range   Sodium 148 (H) 135 - 145 mmol/L   Potassium 4.0 3.5 - 5.1 mmol/L   Chloride 109 98 - 111 mmol/L   CO2 29 22 - 32 mmol/L   Glucose, Bld 140 (H) 70 - 99 mg/dL   BUN 45 (H) 8 - 23 mg/dL   Creatinine, Ser 1.24 (H) 0.44 - 1.00 mg/dL   Calcium 9.3 8.9 - 10.3 mg/dL   GFR, Estimated 42 (L) >60 mL/min   Anion gap  10 5 - 15  CBC with Differential/Platelet  Result Value Ref Range   WBC 20.7 (H) 4.0 - 10.5 K/uL   RBC 3.97 3.87 - 5.11 MIL/uL   Hemoglobin 12.0 12.0 - 15.0 g/dL   HCT 38.0 36.0 - 46.0 %   MCV 95.7 80.0 - 100.0 fL   MCH 30.2 26.0 - 34.0 pg   MCHC 31.6 30.0 - 36.0 g/dL   RDW 15.5 11.5 - 15.5 %   Platelets 404 (H) 150 - 400  K/uL   nRBC 0.0 0.0 - 0.2 %   Neutrophils Relative % 81 %   Neutro Abs 16.5 (H) 1.7 - 7.7 K/uL   Lymphocytes Relative 6 %   Lymphs Abs 1.3 0.7 - 4.0 K/uL   Monocytes Relative 8 %   Monocytes Absolute 1.7 (H) 0.1 - 1.0 K/uL   Eosinophils Relative 0 %   Eosinophils Absolute 0.0 0.0 - 0.5 K/uL   Basophils Relative 0 %   Basophils Absolute 0.1 0.0 - 0.1 K/uL   Immature Granulocytes 5 %   Abs Immature Granulocytes 1.12 (H) 0.00 - 0.07 K/uL  Procalcitonin - Baseline  Result Value Ref Range   Procalcitonin 7.44 ng/mL   I personally reviewed labs  Imaging: 1. Discitis/osteomyelitis at L1-2 with ventral epidural abscess at L1 compressing the thecal sac. Associated right psoas abscess at the level of L2, measuring up to 14 mm. 2. Advanced lumbar spine degeneration with scoliosis and neural impingement as described.   Impression/Plan:  Ms. Schnepf is here for evaluation of ongoing infection and had an MRI on 2/10 which did reveal a component of discitis/epidural abscess.  The patient appears encephalopathic, so I did discuss with the family the current situation.  She does not have an organism to treat but she is on broad-spectrum antibiotics.  We typically would recommend an aspiration of the space in order to guide antibiotic therapy.  Surgical intervention is warranted if they are unable to get an organism to help guide this therapy but I do not see an obvious role for decompression at this time as I do think it carries great risk and we are unsure if antibiotic therapy is working.  If an aspiration is not able to be performed and they would like to proceed with exploration and decompression, I would like anesthesia team to weigh in on the risks and if acceptable, we can proceed with an L1-2 laminectomy.  I did explain to the family the risk of this including bleeding, wound breakdown, further back pain, risk of anesthesia including prolonged intubation, heart issues, and death.  To be  prepared for possible intervention, would recommend EKG, coagulation profile, ESR, CRP.  If any concerns with this, further testing may be needed   1.  Diagnosis: Discitis, epidural abscess  2.  Plan -Would recommend IR for aspiration -L1-2 laminectomy discussed with the family, if they want to proceed and unable to do the aspiration, please let us know and we can start planning.  Would need anesthesia team to evaluate    Deetta Perla, MD

## 2021-09-28 NOTE — Assessment & Plan Note (Addendum)
Seen on MRI's of thoracic and lumbar spine 09/27/21, involves L1-L2.  IR did CT-guided aspiration on 2/13. Follow cultures. ID consulted. Continue Rocephin. Neurosurgery consulted and did offer surgery, family decline given patient's high risk of complications or poor outcome.

## 2021-09-28 NOTE — Progress Notes (Signed)
Progress Note   Patient: Stacy Watts [id number] DOB: 08/20/1930 DOA: 09/23/2021     5 DOS: the patient was seen and examined on 09/28/2021   Brief hospital course: Stacy Watts is a 88 y.o. female with medical history significant for CKD stage IIIa, hypertension, dementia, OSA. She was brought to the hospital because of back pain and increasing confusion.  She was recently treated for UTI with a 3-day course of Bactrim (started Bactrim on 09/19/2021).    She was found to have severe sepsis secondary to acute UTI complicated by AKI and acute metabolic encephalopathy.  She was treated with IV fluids and empiric IV antibiotics.  Assessment and Plan: * Sepsis secondary to UTI [facility])- (present on admission) Presented with leukocytosis, tachycardia, AKI and lactic acidosis consistent with organ dysfunction.  Treated per protocol on admission.  Continue IV antibiotics and follow cultures.  Infection source presumed UTI, pt also has spinal infection.  UTI (urinary tract infection)- (present on admission) Due to anatomical urinary retention in the setting of vaginal and rectal prolapse. -- Foley discontinued but pt had ongoing retention.   Replaced Foley --Cefepime changed to Merrem and Vanc on PM of 2/10 given spinal infection   Back pain- (present on admission) Due to osteomyelitis / discitis / epidural abscess as seen on MRI's 09/27/21.  --Robaxin as needed --Stop Prednisone - tried before imaging for anti-inflammatory given NSAIDs contraindicated with renal failure -- Tylenol as needed --Stopped low-dose gabapentin, too sedating although seemed to have some improvement in her pain -- PT/OT  Hyponatremia- (present on admission) Resolved.  Present on admission, acute.   Likely hypovolemic in the setting of poor p.o. intake.  Presented with sodium 129, improved with IV fluids.  Monitor BMP.  AKI (acute kidney injury) ([place])- (present on admission) Presented with creatinine  3.45, baseline appears around 1.2.  Foley was placed on admission due to obstructive uropathy secondary to vaginal and rectal prolapse -- Improved with IV fluids and Foley Foley had to be replaced, unsuccessful voiding trial 2/9-2/10 Cr now near baseline.  Monitor BMP.  Acute metabolic encephalopathy- (present on admission) Persistent.  Patient has been lethargic for past few days.  AMS was present on admission.   Continue treating infection as outlined.  Supportive care / delirium precautions. Patient seems to have hypoactive delirium, mostly lethargic and sleeping.  Bilateral hydronephrosis- (present on admission) Due to pelvic floor laxity causing vaginal and rectal prolapse.    Voiding trial attempted, still with ongoing retention.   Foley replaced.  Outpatient follow-up with urology and/or gynecology.   HTN (hypertension)- (present on admission) Blood pressures have been elevated, possibly related to pain. Continue torsemide  As needed hydralazine  Added diltiazem 120 mg daily Titrate antihypertensives  OSA (obstructive sleep apnea)- (present on admission) CPAP ordered   Psoas abscess, right ([place])- (present on admission) Mgmt as outlined.  Will request IR to aspirate this or discitis for culture on Monday, not available over weekend.  Abscess in epidural space of lumbar spine- (present on admission) Seen on MRI's of thoracic and lumbar spine 09/27/21.  Mgmt as outlined.  Vertebral osteomyelitis ([place])- (present on admission) Seen on MRI's of thoracic and lumbar spine 09/27/21, involves L1-L2.  Continue Vanc and Merrem. Neurosurgery consulted and did offer surgery, family decline given patient's high risk of complications or poor outcome.   IR consult on Monday to hopefully get CT-guided aspirate for culture.   Intertrigo- (present on admission) Patient has erythema under bilateral breasts, appears consistent with  candidal intertrigo. -- Nystatin powder -- Monitor  closely  NSVT (nonsustained ventricular tachycardia) Central tele reported 9 beat run of VT on 2/10 at 1252.  Pt was reportedly sleeping at the time.   --Telemetry  --Monitor and replace K and Mg  Pressure injury of skin Present on admission.  I agree with the wound description as outlined below. -- Frequently reposition patient -- Local wound care --Monitor closely for signs of infection  Pressure Injury 09/27/21 Sacrum Mid Stage 3 -  Full thickness tissue loss. Subcutaneous fat may be visible but bone, tendon or muscle are NOT exposed. hole (Active)  09/27/21 1033  Location: Sacrum  Location Orientation: Mid  Staging: Stage 3 -  Full thickness tissue loss. Subcutaneous fat may be visible but bone, tendon or muscle are NOT exposed.  Wound Description (Comments): hole  Present on Admission:         Urinary retention- (present on admission) Foley placed on admission was d/c'd yesterday afternoon.  Pt still retaining urine, required multiple in/out caths. She has structural issues related to pelvic organ prolapse causing urinary retention. --Replaced Foley --Likely will need chronic Foley until prolapse issues can be addressed  Hypernatremia Sodium 150 >>148 this morning.  Started on d5w on 2/10.  Patient has been lethargic and mostly sleeping, not taking in enough water. -- Continue D5W -- Encourage oral hydration -- Monitor BMP  Fever Due to spinal infection.  Patient spiked fever 101.46F overnight.  Possibly due to recurrent urinary retention.  No other obvious symptoms aside from ongoing acute back pain without apparent injury or trauma.  MRI of T/L spine showed osteo/discitis and epidural abscess.    CKD (chronic kidney disease) stage 3, GFR 30-59 ml/min (HCC) Baseline creatinine appears around 1.2.  Presented with AKI as outlined. Monitor renal function. Creatinine today 1.10>>1.24, improving        Subjective: Pt seen with two daughters at bedside.  Pt  continues to be lethargic but will briefly awaken.  No acute events reported.  Family discussed option of surgery after meeting with Dr. Lacinda Axon this AM.  They decline surgery but agreeable to attempt aspiration for culture with IR on Monday.    Physical Exam: Vitals:   09/27/21 2025 09/28/21 0508 09/28/21 0550 09/28/21 1022  BP: (!) 131/93 137/76  (!) 123/58  Pulse: 68 (!) 102 99 99  Resp: 20 20  16   Temp: 98.3 F (36.8 C) 99.4 F (37.4 C)  99.3 F (37.4 C)  TempSrc:  Oral  Oral  SpO2: 94% 95%  92%  Weight:      Height:       General exam: somnolent, wakes only briefly, no acute distress Respiratory system: CTAB, no wheezes, rales or rhonchi, normal respiratory effort. Cardiovascular system: normal S1/S2,  RRR, stable nonpitting BLE edema.   GU system: foley in place Central nervous system: unable to assess due to pt somnolence, she does follow commands when awoken Skin: dry, intact, normal temperature Psychiatry: normal mood, congruent affect, judgement and insight appear normal   Data Reviewed:  Labs reviewed and notable for Na 150>>148, Cr 1.10>> 1.24, BUN 45, CRP 2.2, procal 7.44, WBC 17.7>>20.7, Plt 404k , sed rate 52,   Family Communication: Two daughters at bedside on rounds  Disposition: Status is: Inpatient Remains inpatient appropriate because: Severity of illness as outlined above.         Planned Discharge Destination: Skilled nursing facility     Time spent: 45 minutes  Author: Ezekiel Slocumb,  DO 09/28/2021 2:38 PM  For on call review [email or web address].

## 2021-09-28 NOTE — Assessment & Plan Note (Addendum)
Mgmt as outlined. 

## 2021-09-28 NOTE — Evaluation (Signed)
Clinical/Bedside Swallow Evaluation Patient Details  Name: Stacy Watts MRN: [id number] Date of Birth: 1930-10-08  Today's Date: 09/28/2021 Time: SLP Start Time (ACUTE ONLY): 1420 SLP Stop Time (ACUTE ONLY): 1525 SLP Time Calculation (min) (ACUTE ONLY): 65 min  Past Medical History:  Past Medical History:  Diagnosis Date   Hypertension    Past Surgical History:  Past Surgical History:  Procedure Laterality Date   ABDOMINAL HYSTERECTOMY     HPI:  Per admitting H&P "TANEEKA Watts is a 88 y.o. female seen in the hospital today with complaints of back pain.  Patient has already been started on treatment for urinary tract infection however her symptoms including her back pain which is in the middle of the lower back is gotten worse patient denies any neurological symptoms incontinence or tingling.  Patient was started on Bactrim.  HPI is limited secondary to patient's dementia." Family notified MD of coughing with thin liquids, thus warrenting swallow eval.    Assessment / Plan / Recommendation  Clinical Impression  Pt presents with moderate to severe dysphagia with high risk for aspiration. Pt was lethargic throughout assessment and needed cues to participate and keep her eyes open. Once awake and able to participate, Pt tolerated a few Tsp of thin water but then began coughing after 3rd tsp. Same occurrence with nectar thick liquids. Pt needed cues to cough hard as cough was weak and likely nonproductive. After a few minutes of rest, Pt was given one tsp of applesauce. Noted delayed cough but may have been residual from nectar thick liquids. No further boluses given at this time. Family reports Pt has been on and off lethargic for the past few days but has mostly only demonstarted coughing after the thin liquids. Discussed risk of aspiration at present and recommendation not to feed later today unless significantly more alert. Pt is not requesting food or drink and intake has been  very little. Diet altered to Dys 1 with only pudding consistency foods/liquids in the event that she is more alert this evening. Daughter encouraged to stop with any coughing. ST to reassess tomorrow in hopes of improvement and ability to tolerate  purees and honey thick liquids. If she is not more alert, may need to consider NPO due to high risk of aspiration and inability to clear aspirated material with a cough. Back pain is also likely limiting her ability to produce a strong cough. Noted palliative care notes indicating family is trying to determine Code status and further wishes. SLP Visit Diagnosis: Dysphagia, oropharyngeal phase (R13.12)    Aspiration Risk  Moderate aspiration risk;Severe aspiration risk    Diet Recommendation Dysphagia 1 (Puree);Pudding-thick liquid;Other (Comment) (only feed if fully alert)   Medication Administration: Crushed with puree Supervision: Full supervision/cueing for compensatory strategies Compensations: Small sips/bites Postural Changes: Seated upright at 90 degrees;Remain upright for at least 30 minutes after po intake    Other  Recommendations Oral Care Recommendations: Oral care BID    Recommendations for follow up therapy are one component of a multi-disciplinary discharge planning process, led by the attending physician.  Recommendations may be updated based on patient status, additional functional criteria and insurance authorization.  Follow up Recommendations Skilled nursing-short term rehab (<3 hours/day)      Assistance Recommended at Discharge Frequent or constant Supervision/Assistance  Functional Status Assessment Patient has had a recent decline in their functional status and demonstrates the ability to make significant improvements in function in a reasonable and predictable amount of time.  Frequency and Duration min 3x week  1 week       Prognosis Prognosis for Safe Diet Advancement: Guarded Barriers to Reach Goals: Cognitive  deficits;Medication      Swallow Study   General Date of Onset: 09/23/21 HPI: Per admitting H&P "Stacy Watts is a 89 y.o. female seen in the hospital today with complaints of back pain.  Patient has already been started on treatment for urinary tract infection however her symptoms including her back pain which is in the middle of the lower back is gotten worse patient denies any neurological symptoms incontinence or tingling.  Patient was started on Bactrim.  HPI is limited secondary to patient's dementia." Family notified MD of coughing with thin liquids, thus warrenting swallow eval. Type of Study: Bedside Swallow Evaluation Diet Prior to this Study: Regular Respiratory Status: Room air History of Recent Intubation: No Behavior/Cognition: Lethargic/Drowsy Oral Cavity Assessment: Within Functional Limits Oral Cavity - Dentition: Dentures, not available Vision: Impaired for self-feeding Self-Feeding Abilities: Total assist Patient Positioning: Upright in bed Baseline Vocal Quality: Low vocal intensity    Oral/Motor/Sensory Function Overall Oral Motor/Sensory Function: Within functional limits   Ice Chips Ice chips: Within functional limits Presentation: Spoon   Thin Liquid Thin Liquid: Impaired Presentation: Spoon Pharyngeal  Phase Impairments: Cough - Delayed    Nectar Thick Nectar Thick Liquid: Impaired Presentation: Spoon Oral phase functional implications: Prolonged oral transit Pharyngeal Phase Impairments: Throat Clearing - Delayed   Honey Thick Honey Thick Liquid: Not tested   Puree Puree: Impaired Presentation: Spoon Oral Phase Functional Implications: Prolonged oral transit Pharyngeal Phase Impairments: Cough - Delayed   Solid     Solid: Not tested      [name] 09/28/2021,3:25 PM

## 2021-09-29 DIAGNOSIS — R1310 Dysphagia, unspecified: Secondary | ICD-10-CM

## 2021-09-29 DIAGNOSIS — E876 Hypokalemia: Secondary | ICD-10-CM

## 2021-09-29 DIAGNOSIS — K6812 Psoas muscle abscess: Secondary | ICD-10-CM

## 2021-09-29 DIAGNOSIS — A419 Sepsis, unspecified organism: Secondary | ICD-10-CM | POA: Diagnosis not present

## 2021-09-29 DIAGNOSIS — M4620 Osteomyelitis of vertebra, site unspecified: Secondary | ICD-10-CM

## 2021-09-29 DIAGNOSIS — G061 Intraspinal abscess and granuloma: Secondary | ICD-10-CM | POA: Diagnosis not present

## 2021-09-29 LAB — CBC
HCT: 35.1 % — ABNORMAL LOW (ref 36.0–46.0)
Hemoglobin: 11.4 g/dL — ABNORMAL LOW (ref 12.0–15.0)
MCH: 31 pg (ref 26.0–34.0)
MCHC: 32.5 g/dL (ref 30.0–36.0)
MCV: 95.4 fL (ref 80.0–100.0)
Platelets: 397 10*3/uL (ref 150–400)
RBC: 3.68 MIL/uL — ABNORMAL LOW (ref 3.87–5.11)
RDW: 15 % (ref 11.5–15.5)
WBC: 16.5 10*3/uL — ABNORMAL HIGH (ref 4.0–10.5)
nRBC: 0 % (ref 0.0–0.2)

## 2021-09-29 LAB — BASIC METABOLIC PANEL
Anion gap: 7 (ref 5–15)
BUN: 49 mg/dL — ABNORMAL HIGH (ref 8–23)
CO2: 29 mmol/L (ref 22–32)
Calcium: 8.9 mg/dL (ref 8.9–10.3)
Chloride: 106 mmol/L (ref 98–111)
Creatinine, Ser: 1.23 mg/dL — ABNORMAL HIGH (ref 0.44–1.00)
GFR, Estimated: 42 mL/min — ABNORMAL LOW (ref >60)
Glucose, Bld: 131 mg/dL — ABNORMAL HIGH (ref 70–99)
Potassium: 3.4 mmol/L — ABNORMAL LOW (ref 3.5–5.1)
Sodium: 142 mmol/L (ref 135–145)

## 2021-09-29 LAB — LACTIC ACID, PLASMA: Lactic Acid, Venous: 1.8 mmol/L (ref 0.5–1.9)

## 2021-09-29 LAB — PROCALCITONIN: Procalcitonin: 4.24 ng/mL

## 2021-09-29 LAB — MAGNESIUM: Magnesium: 2.2 mg/dL (ref 1.7–2.4)

## 2021-09-29 MED ORDER — POTASSIUM CHLORIDE 10 MEQ/100ML IV SOLN
10.0000 meq | INTRAVENOUS | Status: AC
  Administered 2021-09-29 (×3): 10 meq via INTRAVENOUS
  Filled 2021-09-29: qty 100

## 2021-09-29 MED ORDER — VANCOMYCIN HCL 750 MG/150ML IV SOLN
750.0000 mg | INTRAVENOUS | Status: DC
  Administered 2021-09-29: 750 mg via INTRAVENOUS
  Filled 2021-09-29: qty 150

## 2021-09-29 MED ORDER — KCL-LACTATED RINGERS-D5W 20 MEQ/L IV SOLN
INTRAVENOUS | Status: DC
  Filled 2021-09-29 (×4): qty 1000

## 2021-09-29 NOTE — Consult Note (Signed)
[facility] Nurse Consult Note: Reason for Consult:Stage 3 Sacral Pressure Injury, present on admission Wound type:Pressure Pressure Injury POA: Yes Measurement: Per Benay Pike, Admitting RN on 09/27/21:  0.5cm x 0.5cm x 0.7cm Wound bed:100% red Drainage (amount, consistency, odor) small serous Periwound: Intact Dressing procedure/placement/frequency: Patient is being turned and repositioned from side to side while in bed per house protocol. I have provided Nursing with topical wound care guidance using an antimicrobial nonadherent (xeroform gauze) to cover the wound topped with a dry gauze 2x2 and topped with a silicone foam sacral dressing. The patient's intertriginous contact dermatitis (erythema intertrigo) is being managed with nystatin powder, and I have augmented this POC using our house antimicrobial wicking textile, [employer] Kellie Simmering [id number]). Instructions for the use of this product Nursing are included with Order. Heels are being floated while in bed.  I have provided a pressure redistribution chair pad for the patient's use when OOB in the chair in the downstream facility.  [facility] nursing team will not follow, but will remain available to this patient, the nursing and medical teams.  Please re-consult if needed. Thanks, Maudie Flakes, MSN, RN, [place], Arther Abbott  Pager# [phone]

## 2021-09-29 NOTE — Assessment & Plan Note (Signed)
SLP for swallow evaluation. On pureed diet with thickened liquids. Follow up SLP recommendations.

## 2021-09-29 NOTE — Progress Notes (Addendum)
Progress Note   Patient: DETTE Watts [id number] DOB: [birth year] DOA: 09/23/2021     6 DOS: the patient was seen and examined on 09/29/2021   Brief hospital course: Stacy Watts is a 88 y.o. female with medical history significant for CKD stage IIIa, hypertension, dementia, OSA. She was brought to the hospital because of back pain and increasing confusion.  She was recently treated for UTI with a 3-day course of Bactrim (started Bactrim on 09/19/2021).    She was found to have severe sepsis secondary to acute UTI complicated by AKI and acute metabolic encephalopathy.  She was treated with IV fluids and empiric IV antibiotics.  Assessment and Plan: * Sepsis secondary to UTI [facility])- (present on admission) Presented with leukocytosis, tachycardia, AKI and lactic acidosis consistent with organ dysfunction.  Treated per protocol on admission.  Continue IV antibiotics and follow cultures.  Infection source presumed UTI, pt also has spinal infection.  UTI (urinary tract infection)- (present on admission) Due to anatomical urinary retention in the setting of vaginal and rectal prolapse. -- Foley discontinued but pt had ongoing retention.   Replaced Foley --Cefepime changed to Merrem and Vanc on PM of 2/10 given spinal infection   Back pain- (present on admission) Due to osteomyelitis / discitis / epidural abscess as seen on MRI's 09/27/21.  --Robaxin as needed --Stop Prednisone - tried before imaging for anti-inflammatory given NSAIDs contraindicated with renal failure -- Tylenol as needed --Stopped low-dose gabapentin, too sedating although seemed to have some improvement in her pain -- PT/OT  Hyponatremia- (present on admission) Resolved.  Present on admission, acute.   Likely hypovolemic in the setting of poor p.o. intake.  Presented with sodium 129, improved with IV fluids.  Monitor BMP.  AKI (acute kidney injury) ([place])- (present on admission) Presented with creatinine  3.45, baseline appears around 1.2.  Foley was placed on admission due to obstructive uropathy secondary to vaginal and rectal prolapse -- Improved with IV fluids and Foley Foley had to be replaced, unsuccessful voiding trial 2/9-2/10 Cr now near baseline.  Monitor BMP.  Acute metabolic encephalopathy- (present on admission) Persistent.  Patient has been lethargic for past few days.  AMS was present on admission.   Continue treating infection as outlined.  Supportive care / delirium precautions. Patient seems to have hypoactive delirium, mostly lethargic and sleeping.  2/11: pt a bit more awake and alert this AM than past several days  Bilateral hydronephrosis- (present on admission) Due to pelvic floor laxity causing vaginal and rectal prolapse.    Voiding trial attempted, still with ongoing retention.   Foley replaced.  Outpatient follow-up with urology and/or gynecology.   HTN (hypertension)- (present on admission) Blood pressures have been elevated, possibly related to pain. Continue torsemide  As needed hydralazine  Added diltiazem 120 mg daily Titrate antihypertensives  OSA (obstructive sleep apnea)- (present on admission) CPAP ordered   Hypokalemia K 3.4 today.  Replacing with IVF additive and K riders IV.  Monitor and replace K as needed.   Dysphagia SLP for swallow evaluation. On pureed diet with thickened liquids. Follow up SLP recommendations.  Psoas abscess, right ([place])- (present on admission) Mgmt as outlined.  Will request IR to aspirate this or discitis for culture on Monday, not available over weekend.  Abscess in epidural space of lumbar spine- (present on admission) Seen on MRI's of thoracic and lumbar spine 09/27/21.  Mgmt as outlined.  Vertebral osteomyelitis ([place])- (present on admission) Seen on MRI's of thoracic and lumbar spine  09/27/21, involves L1-L2.  Continue Vanc and Merrem. Neurosurgery consulted and did offer surgery, family decline given  patient's high risk of complications or poor outcome.   IR consult on Monday to hopefully get CT-guided aspirate for culture.   Intertrigo- (present on admission) Patient has erythema under bilateral breasts, appears consistent with candidal intertrigo. -- Nystatin powder -- Monitor closely  NSVT (nonsustained ventricular tachycardia) Central tele reported 9 beat run of VT on 2/10 at 1252.  Pt was reportedly sleeping at the time.   --Telemetry  --Monitor and replace K and Mg  Pressure injury of skin Present on admission.  I agree with the wound description as outlined below. -- Frequently reposition patient -- Local wound care --Monitor closely for signs of infection  Pressure Injury 09/27/21 Sacrum Mid Stage 3 -  Full thickness tissue loss. Subcutaneous fat may be visible but bone, tendon or muscle are NOT exposed. hole (Active)  09/27/21 1033  Location: Sacrum  Location Orientation: Mid  Staging: Stage 3 -  Full thickness tissue loss. Subcutaneous fat may be visible but bone, tendon or muscle are NOT exposed.  Wound Description (Comments): hole  Present on Admission:         Urinary retention- (present on admission) Foley placed on admission was d/c'd yesterday afternoon.  Pt still retaining urine, required multiple in/out caths. She has structural issues related to pelvic organ prolapse causing urinary retention. --Replaced Foley --Likely will need chronic Foley until prolapse issues can be addressed  Hypernatremia Resolved with D5w.   Sodium 150 >>148>>142 this morning.  Started on d5w on 2/10.  Patient has been lethargic and mostly sleeping, not taking in enough water. -- Change D5W to D5-LR+Kcl -- Encourage oral hydration -- Monitor BMP  Fever Due to spinal infection.  Patient spiked fever 101.49F overnight.  Possibly due to recurrent urinary retention.  No other obvious symptoms aside from ongoing acute back pain without apparent injury or trauma.  MRI of T/L  spine showed osteo/discitis and epidural abscess.    CKD (chronic kidney disease) stage 3, GFR 30-59 ml/min (HCC)- (present on admission) Baseline creatinine appears around 1.2.  Presented with AKI as outlined. Monitor renal function. Creatinine today 1.23 stable, at baseline.        Subjective: Pt seen with two daughters at bedside.  SLP working with patient on swallow evaluation.  Pt more awake and alert today, keeps eyes open longer, more easily engaged.  Daughter reports she slept very well for several hours overnight.  No acute events reported.  Pt denies pain or other acute complaints this AM  Physical Exam: Vitals:   09/28/21 1930 09/28/21 1931 09/29/21 0309 09/29/21 0805  BP: (!) 143/85  130/60 (!) 126/48  Pulse: 91  78 80  Resp: 18  20 18   Temp: (!) 100.5 F (38.1 C) 99.9 F (37.7 C) 98.6 F (37 C) (!) 97.4 F (36.3 C)  TempSrc: Oral  Oral Oral  SpO2: 93%  96% 96%  Weight:      Height:       General exam: awake, alert, no acute distress Respiratory system: CTAB, no wheezes, rales or rhonchi, normal respiratory effort. Cardiovascular system: normal S1/S2, RRR   Central nervous system: no gross focal neurologic deficits, normal speech Extremities: moves all, no cyanosis, normal tone Skin: dry, intact, normal temperature Psychiatry: normal mood, congruent affect, judgement and insight appear normal   Data Reviewed:  Labs reviewed and notable for sodium normalized 142, potassium 3.4, glucose 131, BUN 49, creatinine  stable 1.23, lactic acid normal 1.8, procalcitonin downtrending 4.24, WBC downtrending 16.5, hemoglobin stable 11.4  Family Communication: two daughters at bedside on rounds  Disposition: Status is: Inpatient Remains inpatient appropriate because: Severity of illness on IV therapies with ongoing evaluation underway.  Do not anticipate medical readiness for discharge for at least 72 hours or longer.      Planned Discharge Destination: Skilled  nursing facility     Time spent: 35 minutes  Author: Ezekiel Slocumb, DO 09/29/2021 11:42 AM  For on call review [email or web address].

## 2021-09-29 NOTE — Assessment & Plan Note (Addendum)
Resolved.  K 3.5 today. Remains on D5w + Kcl with ongoing poor PO intake.  Monitor and replace K as needed.

## 2021-09-29 NOTE — Progress Notes (Addendum)
Speech Language Pathology Treatment:    Patient Details Name: Stacy Watts MRN: [id number] DOB: [birth year] Today's Date: 09/29/2021 Time: 8841-6606 SLP Time Calculation (min) (ACUTE ONLY): 40 min  Assessment / Plan / Recommendation Clinical Impression  Pt seen for clinical swallowing re-evaluation. Daughters at bedside. Pt required encouragement and assistance for repositioning as well as mod-max verbal/tactile cues to maintain LOA during re-evaluation.   Pt given trials of puree, nectar-thick liquids, and thin liquids via tsp. Oral phase c/b delayed, but functional, bolus retrieval from tsp across all consistencies. No overt or subtle s/sx pharyngeal dysphagia noted across trials. Audible swallow noted with all textures, but family noted baseline.   Recommend cautious initiation of a pureed diet with thin liquids with safe swallowing strategies/aspiration precautions as outlined below including ALL LIQUIDS VIA TEASPOON. Pt will need direct, 1:1 supervision/assistance with all PO intake.   Pt is at increased risk for aspiration/aspiration PNA given mental status, multiple medical comorbidities, and dependence for feeding at present.  SLP to f/u per POC for diet tolerance and continued SLP re-evaluation.   Pt's daughters educated at length re: diet recommendations, safe swallowing strategies/aspiration precautions, s/sx pharyngeal dysphagia to be mindful of (not present on today's evaluation), risks of aspiration/aspiration PNA, and SLP POC. Daughters verbalized understanding/agreement. Pt unable to receive education at this time as pt sleeping at end of session. Signage updated in pt's room. RN made aware of results, recommendations, and SLP POC.     HPI HPI: Per admitting H&P "Stacy Watts is a 88 y.o. female seen in the hospital today with complaints of back pain.  Patient has already been started on treatment for urinary tract infection however her symptoms including her back  pain which is in the middle of the lower back is gotten worse patient denies any neurological symptoms incontinence or tingling.  Patient was started on Bactrim.  HPI is limited secondary to patient's dementia." Family notified MD of coughing with thin liquids, thus warrenting swallow eval.      SLP Plan  Continue with current plan of care      Recommendations for follow up therapy are one component of a multi-disciplinary discharge planning process, led by the attending physician.  Recommendations may be updated based on patient status, additional functional criteria and insurance authorization.    Recommendations  Diet recommendations: Dysphagia 1 (puree);Thin liquid (LIQUIDS VIA TEASPOON ONLY!) Liquids provided via: Teaspoon Medication Administration: Crushed with puree Supervision: Staff to assist with self feeding;Full supervision/cueing for compensatory strategies Compensations: Small sips/bites (ALERT/AWAKE, Allow pt time to retrieve boluses from teaspoon) Postural Changes and/or Swallow Maneuvers: Seated upright 90 degrees;Upright 30-60 min after meal                Oral Care Recommendations: Oral care BID;Oral care before and after PO Follow Up Recommendations: Skilled nursing-short term rehab (<3 hours/day) Assistance recommended at discharge: Frequent or constant Supervision/Assistance SLP Visit Diagnosis: Dysphagia, oropharyngeal phase (R13.12) Plan: Continue with current plan of care         Clyde Canterbury, M.S., CCC-SLP Speech-Language Pathologist [facility] [phone] (ASCOM)   Woodroe Chen  09/29/2021, 11:13 AM

## 2021-09-29 NOTE — Consult Note (Signed)
Pharmacy Antibiotic Note  Stacy Watts is a 88 y.o. female admitted on 09/23/2021 with discitis, osteomyelitis and epidural abscess lumbar spine. Pharmacy has been consulted for vancomycin and meropenem dosing.  Plan: Will adjust Vancomycin to 750 mg IV q48h Goal AUC 400-550  Est AUC: 481.9 Est Cmax: 32.7 Est Cmin: 11.5 Calculated with SCr 1.23  Meropenem 2 g IV q12h per indication and renal function   Monitor clinical picture, renal function, and vancomycin levels at steady state F/U C&S, abx deescalation / LOT   Height: 4\' 11"  (149.9 cm) Weight: 69.8 kg (153 lb 14.1 oz) IBW/kg (Calculated) : 43.2  Temp (24hrs), Avg:99 F (37.2 C), Min:97.4 F (36.3 C), Max:100.5 F (38.1 C)  Recent Labs  Lab 09/23/21 1836 09/23/21 2212 09/24/21 0451 09/25/21 0357 09/26/21 0359 09/27/21 0454 09/28/21 0405 09/29/21 0328  WBC  --   --   --  24.1* 20.4* 17.7* 20.7* 16.5*  CREATININE  --   --    < > 2.10* 1.32* 1.10* 1.24* 1.23*  LATICACIDVEN 1.4 2.3*  --   --   --  0.8  --  1.8   < > = values in this interval not displayed.     Estimated Creatinine Clearance: 26.9 mL/min (A) (by C-G formula based on SCr of 1.23 mg/dL (H)).    Allergies  Allergen Reactions   Amlodipine     Other reaction(s): Dizziness   Cefdinir     Other reaction(s): Unknown   Ciprofloxacin     Other reaction(s): Unknown   Losartan     Other reaction(s): Angioedema   Nitrofurantoin Swelling   Other     UNKNOWN BLOOD PRESSURE MEDICATION   Carvedilol Rash   Clonidine Rash and Swelling    Antimicrobials this admission: 2/6 vancomycin x 1  2/6 flagyl >> 1/7 2/6 cefepime >> 2/10 2/10 vancomycin >>  2/10 meropenem >>  Dose adjustments this admission:   Microbiology results: 2/10 BCx: NGF 2/6 BCx: NG 2/6 UCx: <10K colonies insignificant growth    Thank you for allowing pharmacy to be a part of this patients care.  Pearla Dubonnet, PharmD 09/29/2021 10:17 AM

## 2021-09-30 ENCOUNTER — Encounter: Payer: Self-pay | Admitting: Internal Medicine

## 2021-09-30 ENCOUNTER — Inpatient Hospital Stay: Payer: Medicare Other

## 2021-09-30 DIAGNOSIS — Z7189 Other specified counseling: Secondary | ICD-10-CM | POA: Diagnosis not present

## 2021-09-30 DIAGNOSIS — M4646 Discitis, unspecified, lumbar region: Secondary | ICD-10-CM | POA: Diagnosis not present

## 2021-09-30 DIAGNOSIS — A419 Sepsis, unspecified organism: Secondary | ICD-10-CM | POA: Diagnosis not present

## 2021-09-30 DIAGNOSIS — G061 Intraspinal abscess and granuloma: Secondary | ICD-10-CM

## 2021-09-30 DIAGNOSIS — N390 Urinary tract infection, site not specified: Secondary | ICD-10-CM | POA: Diagnosis not present

## 2021-09-30 LAB — COMPREHENSIVE METABOLIC PANEL
ALT: 13 U/L (ref 0–44)
AST: 17 U/L (ref 15–41)
Albumin: 2.4 g/dL — ABNORMAL LOW (ref 3.5–5.0)
Alkaline Phosphatase: 79 U/L (ref 38–126)
Anion gap: 14 (ref 5–15)
BUN: 46 mg/dL — ABNORMAL HIGH (ref 8–23)
CO2: 31 mmol/L (ref 22–32)
Calcium: 9.1 mg/dL (ref 8.9–10.3)
Chloride: 103 mmol/L (ref 98–111)
Creatinine, Ser: 1.15 mg/dL — ABNORMAL HIGH (ref 0.44–1.00)
GFR, Estimated: 46 mL/min — ABNORMAL LOW (ref >60)
Glucose, Bld: 122 mg/dL — ABNORMAL HIGH (ref 70–99)
Potassium: 3.7 mmol/L (ref 3.5–5.1)
Sodium: 148 mmol/L — ABNORMAL HIGH (ref 135–145)
Total Bilirubin: 0.7 mg/dL (ref 0.3–1.2)
Total Protein: 5.9 g/dL — ABNORMAL LOW (ref 6.5–8.1)

## 2021-09-30 LAB — CBC
HCT: 39.1 % (ref 36.0–46.0)
Hemoglobin: 12.6 g/dL (ref 12.0–15.0)
MCH: 30.4 pg (ref 26.0–34.0)
MCHC: 32.2 g/dL (ref 30.0–36.0)
MCV: 94.2 fL (ref 80.0–100.0)
Platelets: 419 10*3/uL — ABNORMAL HIGH (ref 150–400)
RBC: 4.15 MIL/uL (ref 3.87–5.11)
RDW: 15.1 % (ref 11.5–15.5)
WBC: 21 10*3/uL — ABNORMAL HIGH (ref 4.0–10.5)
nRBC: 0 % (ref 0.0–0.2)

## 2021-09-30 LAB — PROCALCITONIN: Procalcitonin: 2.57 ng/mL

## 2021-09-30 MED ORDER — HEPARIN SODIUM (PORCINE) 5000 UNIT/ML IJ SOLN: 5000.0000 [IU] | Freq: Three times a day (TID) | INTRAMUSCULAR | Status: DC

## 2021-09-30 MED ORDER — HEPARIN SODIUM (PORCINE) 5000 UNIT/ML IJ SOLN
5000.0000 [IU] | Freq: Three times a day (TID) | INTRAMUSCULAR | Status: DC
  Administered 2021-09-30 – 2021-10-02 (×5): 5000 [IU] via SUBCUTANEOUS
  Filled 2021-09-30 (×5): qty 1

## 2021-09-30 MED ORDER — SODIUM CHLORIDE 0.9 % IV SOLN
2.0000 g | INTRAVENOUS | Status: DC
  Administered 2021-10-01 – 2021-10-02 (×2): 2 g via INTRAVENOUS
  Filled 2021-09-30 (×2): qty 20

## 2021-09-30 MED ORDER — MIDAZOLAM HCL 2 MG/2ML IJ SOLN
INTRAMUSCULAR | Status: AC | PRN
  Administered 2021-09-30: .5 mg via INTRAVENOUS

## 2021-09-30 MED ORDER — MIDAZOLAM HCL 2 MG/2ML IJ SOLN
INTRAMUSCULAR | Status: AC
  Filled 2021-09-30: qty 2

## 2021-09-30 MED ORDER — HYDROCODONE-ACETAMINOPHEN 5-325 MG PO TABS
0.5000 | ORAL_TABLET | ORAL | Status: DC | PRN
  Administered 2021-09-30 – 2021-10-01 (×3): 0.5 via ORAL
  Filled 2021-09-30 (×3): qty 1

## 2021-09-30 MED ORDER — FENTANYL CITRATE (PF) 100 MCG/2ML IJ SOLN
INTRAMUSCULAR | Status: AC
  Filled 2021-09-30: qty 2

## 2021-09-30 MED ORDER — HYDROCODONE-ACETAMINOPHEN 5-325 MG PO TABS: 0.5000 | ORAL_TABLET | ORAL | Status: DC | PRN

## 2021-09-30 MED ORDER — POTASSIUM CL IN DEXTROSE 5% 20 MEQ/L IV SOLN
20.0000 meq | INTRAVENOUS | Status: DC
  Administered 2021-09-30 – 2021-10-02 (×3): 20 meq via INTRAVENOUS
  Filled 2021-09-30 (×6): qty 1000

## 2021-09-30 MED ORDER — KCL IN DEXTROSE-NACL 20-5-0.45 MEQ/L-%-% IV SOLN: INTRAVENOUS | Status: DC

## 2021-09-30 MED ORDER — FENTANYL CITRATE (PF) 100 MCG/2ML IJ SOLN
INTRAMUSCULAR | Status: AC | PRN
  Administered 2021-09-30: 25 ug via INTRAVENOUS

## 2021-09-30 NOTE — Progress Notes (Addendum)
Progress Note   Patient: Stacy Watts DOB: 02/14/1931 DOA: 09/23/2021     7 DOS: the patient was seen and examined on 09/30/2021   Brief hospital course: Stacy Watts is a 88 y.o. female with medical history significant for CKD stage IIIa, hypertension, dementia, OSA. She was brought to the hospital because of back pain and increasing confusion.  She was recently treated for UTI with a 3-day course of Bactrim (started Bactrim on 09/19/2021).    She was found to have severe sepsis secondary to acute UTI complicated by AKI and acute metabolic encephalopathy.  She was treated with IV fluids and empiric IV antibiotics.  Assessment and Plan: * Sepsis secondary to UTI [facility])- (present on admission) Presented with leukocytosis, tachycardia, AKI and lactic acidosis consistent with organ dysfunction.  Treated per protocol on admission.  Continue IV antibiotics and follow cultures.  Infection source presumed UTI, pt also has spinal infection.  UTI (urinary tract infection)- (present on admission) Due to anatomical urinary retention in the setting of vaginal and rectal prolapse. -- Foley discontinued but pt had ongoing retention.   Replaced Foley --Cefepime changed to Merrem and Vanc on PM of 2/10 given spinal infection   Back pain- (present on admission) Due to osteomyelitis / discitis / epidural abscess as seen on MRI's 09/27/21.  --Robaxin and low-dose Norco as needed --Stop Prednisone - tried before imaging for anti-inflammatory given NSAIDs contraindicated with renal failure -- Tylenol as needed --Stopped low-dose gabapentin, too sedating although seemed to have some improvement in her pain -- PT/OT  Hyponatremia- (present on admission) Resolved.  Present on admission, acute.   Likely hypovolemic in the setting of poor p.o. intake.  Presented with sodium 129, improved with IV fluids.  Monitor BMP.  AKI (acute kidney injury) ([place])- (present on admission) Presented  with creatinine 3.45, baseline appears around 1.2.  Foley was placed on admission due to obstructive uropathy secondary to vaginal and rectal prolapse -- Improved with IV fluids and Foley Foley had to be replaced, unsuccessful voiding trial 2/9-2/10 Cr now at baseline.  Monitor BMP.  Acute metabolic encephalopathy- (present on admission) Persistent.  Patient has been lethargic for past few days.  AMS was present on admission.   Continue treating infection as outlined.  Supportive care / delirium precautions. Patient seems to have hypoactive delirium, mostly lethargic and sleeping.  2/13: Little more alert yesterday and today but still mostly drowsy and falls asleep quickly.    Bilateral hydronephrosis- (present on admission) Due to pelvic floor laxity causing vaginal and rectal prolapse.    Voiding trial attempted, still with ongoing retention.   Foley replaced.  Outpatient follow-up with urology and/or gynecology.   HTN (hypertension)- (present on admission) Blood pressures have been elevated, possibly related to pain. Continue torsemide  As needed hydralazine  Added diltiazem 120 mg daily Titrate antihypertensives  OSA (obstructive sleep apnea)- (present on admission) CPAP ordered   Hypokalemia Resolved, recurrent yesterday and replaced with IV fluid additive and K riders K 3.7 today.   Monitor and replace K as needed.   Dysphagia SLP for swallow evaluation. On pureed diet with thickened liquids. Follow up SLP recommendations.  Psoas abscess, right ([place])- (present on admission) Mgmt as outlined.    Abscess in epidural space of lumbar spine- (present on admission) Seen on MRI's of thoracic and lumbar spine 09/27/21.  Mgmt as outlined.  Vertebral osteomyelitis ([place])- (present on admission) Seen on MRI's of thoracic and lumbar spine 09/27/21, involves L1-L2.  Continue  Vanc and Merrem. Neurosurgery consulted and did offer surgery, family decline given patient's high risk of  complications or poor outcome.   IR consult for CT-guided aspiration for culture. ID consulted for antibiotic recommendations.   Intertrigo- (present on admission) Patient has erythema under bilateral breasts, appears consistent with candidal intertrigo. -- Nystatin powder -- Monitor closely  NSVT (nonsustained ventricular tachycardia) Central tele reported 9 beat run of VT on 2/10 at 1252.  Pt was reportedly sleeping at the time.   --Telemetry  --Monitor and replace K and Mg  Pressure injury of skin Present on admission.  I agree with the wound description as outlined below. -- Frequently reposition patient -- Local wound care --Monitor closely for signs of infection  Pressure Injury 09/27/21 Sacrum Mid Stage 3 -  Full thickness tissue loss. Subcutaneous fat may be visible but bone, tendon or muscle are NOT exposed. hole (Active)  09/27/21 1033  Location: Sacrum  Location Orientation: Mid  Staging: Stage 3 -  Full thickness tissue loss. Subcutaneous fat may be visible but bone, tendon or muscle are NOT exposed.  Wound Description (Comments): hole  Present on Admission:         Urinary retention- (present on admission) Foley placed on admission was d/c'd yesterday afternoon.  Pt still retaining urine, required multiple in/out caths. She has structural issues related to pelvic organ prolapse causing urinary retention. --Replaced Foley --Likely will need chronic Foley until prolapse issues can be addressed  Hypernatremia Recurrent.  Resolved with D5w started on 2/10, recurrent today with sodium again 148.   Sodium 150 >>148>>142 this morning.  Patient has been lethargic and mostly sleeping, very limited p.o. intake -- Change fluid back to D5 with KCl additive  -- Encourage oral hydration -- Monitor BMP  Fever Due to spinal infection.  Patient spiked fever 101.75F overnight.  Possibly due to recurrent urinary retention.  No other obvious symptoms aside from ongoing acute  back pain without apparent injury or trauma.  MRI of T/L spine showed osteo/discitis and epidural abscess.    CKD (chronic kidney disease) stage 3, GFR 30-59 ml/min ([facility])- (present on admission) Baseline creatinine appears around 1.2.  Presented with AKI as outlined. Monitor renal function. Creatinine today 1.15 stable, at baseline.        Subjective: Patient awake laying on the bedpan with 2 daughters at bedside when seen this morning.  She seems more able to stay awake and interactive but daughters reports she is again more drowsy than she was yesterday.  Patient endorses back pain but not wincing during my encounter.  Daughters remain agreeable to IR procedure for culture purposes.  Physical Exam: Vitals:   09/30/21 0335 09/30/21 0748 09/30/21 1426 09/30/21 1500  BP: (!) 126/44 133/70 111/72 130/72  Pulse: (!) 40 80 85 88  Resp: 20 18 16 18   Temp: 98.7 F (37.1 C) 98.2 F (36.8 C)    TempSrc:  Oral    SpO2: 94% 95% 98% 98%  Weight:      Height:       General exam: awake laying on bedpan, no acute distress HEENT: atraumatic, clear conjunctiva, anicteric sclera, moist mucus membranes, hearing grossly normal  Respiratory system: CTAB, no wheezes, rales or rhonchi, normal respiratory effort. Cardiovascular system: normal S1/S2, RRR, stable lower extremity edema.   Gastrointestinal system: soft, NT, ND, no HSM felt, +bowel sounds. Central nervous system: no gross focal neurologic deficits, normal speech Skin: dry, intact, normal temperature Psychiatry: normal mood, congruent affect, abnormal judgement and  insight due to dementia   Data Reviewed:  Labs reviewed and notable for sodium elevated 148, potassium normalized to 3.7, glucose 122, BUN 46, creatinine 1.15, albumin 2.4, procalcitonin downtrending 2.57, white count increased from 16.5-21.0, platelets elevated 419  Family Communication: 2 daughters at bedside on rounds  Disposition: Status is: Inpatient Remains  inpatient appropriate because: Severity of illness on IV antibiotics for vertebral infection as outlined above with IR procedure planned for today          Planned Discharge Destination: Skilled nursing facility     Time spent: 35 minutes  Author: Ezekiel Slocumb, DO 09/30/2021 3:09 PM  For on call review [email or web address].

## 2021-09-30 NOTE — H&P (Signed)
Chief Complaint: Patient was seen in consultation today for right paraspinal abscess   at the request of Nicole Kindred, DO  Referring Physician(s): Nicole Kindred, DO  Supervising Physician: Sandi Mariscal  Patient Status: [place] - In-pt  History of Present Illness: Stacy Watts is a 88 y.o. female w/ PMH significant for CKD stage III, HTN, dementia and OSA. Pt was admitted for sepsis secondary to UTI and AMS. Pt also c/o back pain on admission. MR lumbar spine 09/27/21 resulted discitis/osteomyelitis at L1-2 with ventral epidural abscess. Pt was referred to IR for disc abscess aspiration by Dr. Nicole Kindred. Dr. Ronny Bacon approved pt for Ct guided right paraspinal abscess.   09/27/21 MR lumbar spine: IMPRESSION: 1. Discitis/osteomyelitis at L1-2 with ventral epidural abscess at L1 compressing the thecal sac. Associated right psoas abscess at the level of L2, measuring up to 14 mm. 2. Advanced lumbar spine degeneration with scoliosis and neural impingement as described.  Past Medical History:  Diagnosis Date   Hypertension     Past Surgical History:  Procedure Laterality Date   ABDOMINAL HYSTERECTOMY      Allergies: Amlodipine, Cefdinir, Ciprofloxacin, Losartan, Nitrofurantoin, Other, Carvedilol, and Clonidine  Medications: Prior to Admission medications   Medication Sig Start Date End Date Taking? Authorizing Provider  aspirin EC 81 MG tablet Take 81 mg by mouth daily.   Yes [provider]  cholecalciferol (VITAMIN D) 1000 units tablet Take 1,000 Units by mouth daily.   Yes [provider]  potassium chloride (KLOR-CON) 10 MEQ tablet Take 10 mEq by mouth daily. 07/10/21  Yes [provider]  torsemide (DEMADEX) 20 MG tablet Take 20 mg by mouth 2 (two) times daily. 09/16/21  Yes [provider]  acetaminophen (TYLENOL) 500 MG tablet Take 500 mg by mouth every 6 (six) hours as needed.    [provider]     History  reviewed. No pertinent family history.  Social History   Socioeconomic History   Marital status: Widowed    Spouse name: Not on file   Number of children: Not on file   Years of education: Not on file   Highest education level: Not on file  Occupational History   Not on file  Tobacco Use   Smoking status: Never   Smokeless tobacco: Never  Substance and Sexual Activity   Alcohol use: No   Drug use: Not on file   Sexual activity: Not on file  Other Topics Concern   Not on file  Social History Narrative   Not on file   Social Determinants of Health   Financial Resource Strain: Not on file  Food Insecurity: Not on file  Transportation Needs: Not on file  Physical Activity: Not on file  Stress: Not on file  Social Connections: Not on file    Review of Systems: A 12 point ROS discussed and pertinent positives are indicated in the HPI above.  All other systems are negative.  Review of Systems  Reason unable to perform ROS: Altered mental status.   Vital Signs: BP 133/70    Pulse 80    Temp 98.2 F (36.8 C) (Oral)    Resp 18    Ht 4\' 11"  (1.499 m)    Wt 153 lb 14.1 oz (69.8 kg)    SpO2 95%    BMI 31.08 kg/m   Physical Exam Vitals reviewed.  Constitutional:      Appearance: She is ill-appearing.  HENT:     Head: Normocephalic and  atraumatic.     Nose:     Comments: O2 via Pinch to nares    Mouth/Throat:     Mouth: Mucous membranes are dry.     Pharynx: Oropharynx is clear.  Eyes:     Extraocular Movements: Extraocular movements intact.     Pupils: Pupils are equal, round, and reactive to light.  Cardiovascular:     Rate and Rhythm: Normal rate and regular rhythm.     Pulses: Normal pulses.     Heart sounds: Normal heart sounds. No murmur heard.   No friction rub. No gallop.  Pulmonary:     Effort: Pulmonary effort is normal. No respiratory distress.     Breath sounds: No stridor. Rhonchi present. No wheezing or rales.     Comments: Rhonchi in all  fields Abdominal:     General: Bowel sounds are normal. There is no distension.     Palpations: Abdomen is soft.     Tenderness: There is no abdominal tenderness. There is no guarding.  Musculoskeletal:     Right lower leg: Edema present.     Left lower leg: Edema present.  Skin:    General: Skin is warm and dry.  Neurological:     Mental Status: She is alert. She is disoriented.  Psychiatric:        Mood and Affect: Mood normal.        Behavior: Behavior normal.    Imaging: CT ABDOMEN PELVIS WO CONTRAST  Result Date: 09/23/2021 CLINICAL DATA:  Back pain, urinary tract infection, lower extremity edema EXAM: CT ABDOMEN AND PELVIS WITHOUT CONTRAST TECHNIQUE: Multidetector CT imaging of the abdomen and pelvis was performed following the standard protocol without IV contrast. Unenhanced CT was performed per clinician order. Lack of IV contrast limits sensitivity and specificity, especially for evaluation of abdominal/pelvic solid viscera. RADIATION DOSE REDUCTION: This exam was performed according to the departmental dose-optimization program which includes automated exposure control, adjustment of the mA and/or kV according to patient size and/or use of iterative reconstruction technique. COMPARISON:  None. FINDINGS: Lower chest: No acute pleural or parenchymal lung disease. Mild cardiomegaly with trace pericardial fluid. Hepatobiliary: The gallbladder is surgically absent. Subcentimeter cyst left lobe liver. Otherwise unremarkable unenhanced appearance of the liver. Pancreas: Unremarkable unenhanced appearance. Spleen: Unremarkable unenhanced appearance. Adrenals/Urinary Tract: No evidence of urinary tract calculi within either kidney. There is mild bilateral hydronephrosis and hydroureter, left greater than right, likely due to marked bladder distension. No filling defects or bladder wall irregularity identified. Stomach/Bowel: No bowel obstruction or ileus. Normal appendix right mid abdomen. No  bowel wall thickening or inflammatory change. Vascular/Lymphatic: Aortic atherosclerosis. No enlarged abdominal or pelvic lymph nodes. Reproductive: Status post hysterectomy. No adnexal masses. Other: There is evidence of pelvic floor laxity with vaginal and rectal prolapse. No free intraperitoneal fluid or free gas. No abdominal wall hernia. Musculoskeletal: No acute or destructive bony lesions. Reconstructed images demonstrate no additional findings. IMPRESSION: 1. Marked distension of the urinary bladder, with resulting mild bilateral hydronephrosis and hydroureter left greater than right. No urinary tract calculi. 2. Pelvic floor laxity, with vaginal and rectal prolapse. 3.  Aortic Atherosclerosis (ICD10-I70.0). 4. Cardiomegaly with trace pericardial fluid. Electronically Signed   By: Randa Ngo M.D.   On: 09/23/2021 18:09   MR THORACIC SPINE W WO CONTRAST  Result Date: 09/27/2021 CLINICAL DATA:  Mid back pain.  Increased confusion.  UTI. EXAM: MRI THORACIC AND LUMBAR SPINE WITHOUT AND WITH CONTRAST TECHNIQUE: Multiplanar and multiecho pulse sequences of  the thoracic and lumbar spine were obtained without and with intravenous contrast. CONTRAST:  9mL GADAVIST GADOBUTROL 1 MMOL/ML IV SOLN COMPARISON:  None similar FINDINGS: MRI THORACIC SPINE FINDINGS Alignment: Degenerative exaggerated thoracic kyphosis. Mild degenerative anterolisthesis at T2-3 to T5-6. Vertebrae: No evidence of fracture, discitis, or aggressive bone lesion. Cord:  Normal signal and morphology. Paraspinal and other soft tissues: Small right pleural effusion. Disc levels: Generalized disc space narrowing and desiccation. Multiple small noncompressive disc protrusions including at T5-6, right paracentral at T6-7, centrally at T7-8, and centrally at T8-9. Especially notable disc space narrowing with spurring of endplates and facets causing advanced left foraminal impingement at T11-12. MRI LUMBAR SPINE FINDINGS Segmentation:  5 lumbar type  vertebra Alignment: Hyperlordosis with L4-5 and L5-S1 anterolisthesis. Mild scoliosis. Vertebrae: T2 hyperintensity in the disc with indistinct endplates on T1 weighted imaging and marrow/paravertebral edema, consistent with discitis and osteomyelitis. Ventral epidural abscess is present posterior to the L1 body, best seen on sagittal postcontrast imaging where thickness is 9 mm and there is cauda equina compression. Additional right psoas abscess at the level of L2, measuring up to 14 mm on axial images. Conus medullaris: Extends to the approximately L1 level. Sacral meningeal cysts with bony scalloping. Paraspinal and other soft tissues: Right psoas abscess as noted above. Disc levels: T12- L1: Disc narrowing and bulging with left foraminal protrusion and impingement L1-L2: Disc narrowing and bulging. Ligamentum flavum thickening and facet spurring. Degenerative and infectious related thecal sac compression. Mild-to-moderate bilateral foraminal narrowing L2-L3: Asymmetric rightward disc collapse, endplate spurring, and facet spurring. Right foraminal and subarticular recess impingement L3-L4: Disc narrowing and bulging with asymmetric rightward endplate and facet spurring. Right foraminal impingement. Right subarticular recess stenosis. Spinal stenosis is moderate L4-L5: Facet degeneration with spurring and anterolisthesis. Disc space narrowing and bulging with left foraminal impingement. Left L5 impingement at the subarticular recess as well. Right foraminal narrowing is moderate L5-S1:Facet degeneration with bulky spurring and anterolisthesis. Suspect an anterior synovial cyst at the left facet. Advanced left foraminal impingement when combined with disc bulging. Critical Value/emergent results were called by telephone at the time of interpretation on 09/27/2021 at 7:05 pm to provider PA Randol Kern , who verbally acknowledged these results. IMPRESSION: 1. Discitis/osteomyelitis at L1-2 with ventral epidural abscess  at L1 compressing the thecal sac. Associated right psoas abscess at the level of L2, measuring up to 14 mm. 2. Advanced lumbar spine degeneration with scoliosis and neural impingement as described. Electronically Signed   By: Jorje Guild M.D.   On: 09/27/2021 19:08   MR Lumbar Spine W Wo Contrast  Result Date: 09/27/2021 CLINICAL DATA:  Mid back pain.  Increased confusion.  UTI. EXAM: MRI THORACIC AND LUMBAR SPINE WITHOUT AND WITH CONTRAST TECHNIQUE: Multiplanar and multiecho pulse sequences of the thoracic and lumbar spine were obtained without and with intravenous contrast. CONTRAST:  85mL GADAVIST GADOBUTROL 1 MMOL/ML IV SOLN COMPARISON:  None similar FINDINGS: MRI THORACIC SPINE FINDINGS Alignment: Degenerative exaggerated thoracic kyphosis. Mild degenerative anterolisthesis at T2-3 to T5-6. Vertebrae: No evidence of fracture, discitis, or aggressive bone lesion. Cord:  Normal signal and morphology. Paraspinal and other soft tissues: Small right pleural effusion. Disc levels: Generalized disc space narrowing and desiccation. Multiple small noncompressive disc protrusions including at T5-6, right paracentral at T6-7, centrally at T7-8, and centrally at T8-9. Especially notable disc space narrowing with spurring of endplates and facets causing advanced left foraminal impingement at T11-12. MRI LUMBAR SPINE FINDINGS Segmentation:  5 lumbar type vertebra Alignment: Hyperlordosis with  L4-5 and L5-S1 anterolisthesis. Mild scoliosis. Vertebrae: T2 hyperintensity in the disc with indistinct endplates on T1 weighted imaging and marrow/paravertebral edema, consistent with discitis and osteomyelitis. Ventral epidural abscess is present posterior to the L1 body, best seen on sagittal postcontrast imaging where thickness is 9 mm and there is cauda equina compression. Additional right psoas abscess at the level of L2, measuring up to 14 mm on axial images. Conus medullaris: Extends to the approximately L1 level. Sacral  meningeal cysts with bony scalloping. Paraspinal and other soft tissues: Right psoas abscess as noted above. Disc levels: T12- L1: Disc narrowing and bulging with left foraminal protrusion and impingement L1-L2: Disc narrowing and bulging. Ligamentum flavum thickening and facet spurring. Degenerative and infectious related thecal sac compression. Mild-to-moderate bilateral foraminal narrowing L2-L3: Asymmetric rightward disc collapse, endplate spurring, and facet spurring. Right foraminal and subarticular recess impingement L3-L4: Disc narrowing and bulging with asymmetric rightward endplate and facet spurring. Right foraminal impingement. Right subarticular recess stenosis. Spinal stenosis is moderate L4-L5: Facet degeneration with spurring and anterolisthesis. Disc space narrowing and bulging with left foraminal impingement. Left L5 impingement at the subarticular recess as well. Right foraminal narrowing is moderate L5-S1:Facet degeneration with bulky spurring and anterolisthesis. Suspect an anterior synovial cyst at the left facet. Advanced left foraminal impingement when combined with disc bulging. Critical Value/emergent results were called by telephone at the time of interpretation on 09/27/2021 at 7:05 pm to provider PA Randol Kern , who verbally acknowledged these results. IMPRESSION: 1. Discitis/osteomyelitis at L1-2 with ventral epidural abscess at L1 compressing the thecal sac. Associated right psoas abscess at the level of L2, measuring up to 14 mm. 2. Advanced lumbar spine degeneration with scoliosis and neural impingement as described. Electronically Signed   By: Jorje Guild M.D.   On: 09/27/2021 19:08   DG Chest Port 1 View  Result Date: 09/27/2021 CLINICAL DATA:  Fever. EXAM: PORTABLE CHEST 1 VIEW COMPARISON:  Chest x-ray dated September 23, 2021. FINDINGS: Unchanged mild cardiomegaly. Normal pulmonary vascularity. Increasing opacity at the right lung base with possible new small right pleural  effusion. The left lung is clear. No pneumothorax. No acute osseous abnormality. IMPRESSION: 1. Increasing right basilar atelectasis versus infiltrate with possible new small right pleural effusion. Electronically Signed   By: Titus Dubin M.D.   On: 09/27/2021 08:55   DG Chest Portable 1 View  Result Date: 09/23/2021 CLINICAL DATA:  Shortness of breath EXAM: PORTABLE CHEST 1 VIEW COMPARISON:  Previous studies including the examination of 12/07/2010 FINDINGS: Transverse diameter of heart is increased. There is poor inspiration. There are linear densities in the lower lung fields. There are no signs of pulmonary edema or focal pulmonary consolidation. Lateral CP angles are indistinct. There is no pneumothorax. Degenerative changes are noted in the left shoulder. IMPRESSION: Cardiomegaly. Linear densities in the lower lung fields suggest scarring or subsegmental atelectasis. Blunting of lateral CP angles may suggest pleural thickening or minimal effusions. There are no signs of alveolar pulmonary edema or focal pulmonary consolidation. Electronically Signed   By: Elmer Picker M.D.   On: 09/23/2021 17:31    Labs:  CBC: Recent Labs    09/27/21 0454 09/28/21 0405 09/29/21 0328 09/30/21 0457  WBC 17.7* 20.7* 16.5* 21.0*  HGB 11.4* 12.0 11.4* 12.6  HCT 35.8* 38.0 35.1* 39.1  PLT 390 404* 397 419*    COAGS: Recent Labs    09/28/21 0916  INR 1.1  APTT 37*    BMP: Recent Labs    09/27/21  [id number] 09/28/21 0405 09/29/21 0328 09/30/21 0457  NA 150* 148* 142 148*  K 4.6 4.0 3.4* 3.7  CL 116* 109 106 103  CO2 28 29 29 31   GLUCOSE 135* 140* 131* 122*  BUN 37* 45* 49* 46*  CALCIUM 9.4 9.3 8.9 9.1  CREATININE 1.10* 1.24* 1.23* 1.15*  GFRNONAA 48* 42* 42* 46*    LIVER FUNCTION TESTS: Recent Labs    09/23/21 1640 09/30/21 0457  BILITOT 0.4 0.7  AST 23 17  ALT 20 13  ALKPHOS 170* 79  PROT 6.8 5.9*  ALBUMIN 2.8* 2.4*    TUMOR MARKERS: No results for input(s): AFPTM, CEA,  CA199, CHROMGRNA in the last 8760 hours.  Assessment and Plan: History of CKD stage III, HTN, dementia and OSA. Pt was admitted for sepsis secondary to UTI and AMS. Pt also c/o back pain on admission. MR lumbar spine 09/27/21 resulted discitis/osteomyelitis at L1-2 with ventral epidural abscess. Pt was referred to IR for disc abscess aspiration by Dr. Nicole Kindred. Dr. Ronny Bacon approved pt for Ct guided right paraspinal abscess.   09/27/21 MR lumbar spine: IMPRESSION: 1. Discitis/osteomyelitis at L1-2 with ventral epidural abscess at L1 compressing the thecal sac. Associated right psoas abscess at the level of L2, measuring up to 14 mm. 2. Advanced lumbar spine degeneration with scoliosis and neural impingement as described.  Pt resting in bed with two sisters at bedside.  She is alert and calm. She is in no distress.  Per sisters, pt had a bite of applesauce around 0800 this morning with meds. They state she has had nothing by mouth since.  Pt is currently on 2 L O2. Family states she does not use O2 at home.  They add she has not used her CPAP for OSA in several years.  Pt had 5000 units SQ heparin at 0635 this morning.   WBC today 21.0  Risks and benefits of right paraspinal abscess aspiration was discussed with the patient and/or patient's family including, but not limited to bleeding, infection, damage to adjacent structures or low yield requiring additional tests.  All of the questions were answered and there is agreement to proceed.  Consent signed and in chart.   Thank you for this interesting consult.  I greatly enjoyed meeting Hermalinda Ertel [place] and look forward to participating in their care.  A copy of this report was sent to the requesting provider on this date.  Electronically Signed: Tyson Alias, NP 09/30/2021, 1:02 PM   I spent a total of 25 minutes in face to face in clinical consultation, greater than 50% of which was counseling/coordinating care for right  paraspinal abscess aspiration.

## 2021-09-30 NOTE — Progress Notes (Signed)
Occupational Therapy Treatment Patient Details Name: Stacy Watts MRN: [id number] DOB: 1931/04/14 Today's Date: 09/30/2021   History of present illness 88 y.o. female who presented to [facility] 2/6 with back pain and increasing confusion. She was found to have severe sepsis secondary to acute UTI complicated by AKI and acute metabolic encephalopathy. PMH significant for: HTN, vaginal/rectal prolapse, CKD stage IIIa, dementia, OSA   OT comments  Pt seen for OT/PT co-treatment on this date. Upon arrival to room, pt awake in bed voicing urge to have BM. Pt required MAX A+2 for rolling (to place bedpan) and MAX A+2 for bed-level peri-care. Pt performed supine>sit transfer with MAX A+2. Pt maintained sitting balance at EOB for ~10 mins requiring CGA-MIN A while engaging in seated ADLs. Pt currently requires MAX A for seated LB dressing and MIN A to wash face with washcloth while seated EOB. Following MAX A+2 for sit>stand attempts, pt was unable to clear hips from bed and required TOTAL A (+2 person for safety) to laterally scoot toward HOB. At end of session, pt left in bed, with bed place in chair position, in no acute distress. Pt continues to present with impaired cognition, decreased strength, and decreased activity tolerance however is making good progress toward goals. Pt continues to benefit from skilled OT services to maximize return to PLOF and minimize risk of future falls, injury, caregiver burden, and readmission. Will continue to follow POC. Discharge recommendation remains appropriate.     Recommendations for follow up therapy are one component of a multi-disciplinary discharge planning process, led by the attending physician.  Recommendations may be updated based on patient status, additional functional criteria and insurance authorization.    Follow Up Recommendations  Skilled nursing-short term rehab (<3 hours/day)    Assistance Recommended at Discharge Frequent or constant  Supervision/Assistance  Patient can return home with the following  Two people to help with walking and/or transfers;Two people to help with bathing/dressing/bathroom   Equipment Recommendations  Other (comment) (defer to next venue of care)       Precautions / Restrictions Precautions Precautions: Fall Precaution Comments: Aspiration Restrictions Weight Bearing Restrictions: No       Mobility Bed Mobility Overal bed mobility: Needs Assistance Bed Mobility: Supine to Sit, Sit to Supine Rolling: Max assist, +2 for physical assistance   Supine to sit: Max assist, +2 for physical assistance Sit to supine: Max assist, +2 for physical assistance        Transfers Overall transfer level: Needs assistance Equipment used: 2 person hand held assist Transfers: Sit to/from Stand Sit to Stand: Max assist, +2 safety/equipment          Lateral/Scoot Transfers: Total assist, +2 physical assistance General transfer comment: Following MAX A+2 for sit>stand attempts, pt unable to clear hips from bed. Requires TOTAL A (+2 person for safety) to laterally scoot toward [facility]     Balance Overall balance assessment: Needs assistance Sitting-balance support: Single extremity supported, Feet supported Sitting balance-Leahy Scale: Fair Sitting balance - Comments: fluctuating between CGA to MIN A during seated grooming task     Standing balance-Leahy Scale: Zero Standing balance comment: unable to stand this date following MAX A+2                           ADL either performed or assessed with clinical judgement   ADL Overall ADL's : Needs assistance/impaired     Grooming: Wash/dry face;Minimal assistance;Sitting Grooming Details (indicate cue type and  reason): Able to bring washcloth up to each region of face following multi-modal cues for sequencing. Requires intermittent MIN A for sitting balance while completing EOB.             Lower Body Dressing: Maximal  assistance;Sitting/lateral leans Lower Body Dressing Details (indicate cue type and reason): to don/doff socks     Toileting- Clothing Manipulation and Hygiene: Maximal assistance;+2 for physical assistance;Bed level                Cognition Arousal/Alertness: Lethargic Behavior During Therapy: Anxious Overall Cognitive Status: History of cognitive impairments - at baseline                                 General Comments: Pt oriented to self only. Lethargic during session and requiring multi-modal cues to follow1-step commands                   Pertinent Vitals/ Pain       Pain Assessment Pain Assessment: 0-10 Pain Score: 10-Worst pain ever Pain Location: back, sacrum Pain Descriptors / Indicators: Guarding, Grimacing Pain Intervention(s): Repositioned, Monitored during session         Frequency  Min 2X/week        Progress Toward Goals  OT Goals(current goals can now be found in the care plan section)  Progress towards OT goals: Progressing toward goals  Acute Rehab OT Goals OT Goal Formulation: With family Time For Goal Achievement: 10/09/21 Potential to Achieve Goals: [place] Discharge plan remains appropriate;Frequency remains appropriate    Co-evaluation    PT/OT/SLP Co-Evaluation/Treatment: Yes Reason for Co-Treatment: Necessary to address cognition/behavior during functional activity;For patient/therapist safety;To address functional/ADL transfers PT goals addressed during session: Mobility/safety with mobility OT goals addressed during session: ADL's and self-care      AM-PAC OT "6 Clicks" Daily Activity     Outcome Measure   Help from another person eating meals?: A Lot Help from another person taking care of personal grooming?: A Little Help from another person toileting, which includes using toliet, bedpan, or urinal?: A Lot Help from another person bathing (including washing, rinsing, drying)?: Total Help from  another person to put on and taking off regular upper body clothing?: A Lot Help from another person to put on and taking off regular lower body clothing?: A Lot 6 Click Score: 12    End of Session Equipment Utilized During Treatment: Oxygen;Gait belt  OT Visit Diagnosis: Muscle weakness (generalized) (M62.81);Pain Pain - part of body:  (buttocks)   Activity Tolerance Patient tolerated treatment well;Patient limited by lethargy   Patient Left in bed;with call bell/phone within reach;with bed alarm set;with family/visitor present   Nurse Communication Mobility status        Time: [id number] OT Time Calculation (min): 39 min  Charges: OT General Charges $OT Visit: 1 Visit OT Treatments $Self Care/Home Management : 23-37 mins  Fredirick Maudlin, [place]

## 2021-09-30 NOTE — Care Management Important Message (Signed)
Important Message  Patient Details  Name: Stacy Watts MRN: [id number] Date of Birth: 12-Apr-1931   Medicare Important Message Given:  Yes     Dannette Barbara 09/30/2021, 10:53 AM

## 2021-09-30 NOTE — Consult Note (Signed)
NAME: Stacy BeaverCatherine W Watts  DOB: Nov 16, 1930  MRN: [id number]  Date/Time: 09/30/2021 2:30 PM  REQUESTING PROVIDER: Dr. Esaw GrandchildKelly Griffith Subjective:  REASON FOR CONSULT: Discitis and osteomyelitis of the lumbar vertebra Patient is a poor historian.  Chart reviewed.  Spoke with her daughters. Stacy BeaverCatherine W Watts is a 88 y.o. female with a history of Hypertension, CKD presented from home on 09/23/2021 with low back pain - was recently treated for UTI on 09/18/21 with Bactrim. In the ED BP 142/57, temperature 97.7, pulse 92 and respiratory 22.  WBC 31, Hb 13.1, platelet 214 and creatinine was 3.45.  CT abdomen and pelvis was done and that showed marked distention of the urinary bladder with resulting mild bilateral hydronephrosis and hydroureter left greater than right.  There was also pelvic floor laxity with vaginal and rectal prolapse seen.  Blood culture sent.  Foley catheter placed.  And she was started on antibiotics for UTI.  UA did not show any WBCs.  Because of worsening back pain and persistent WBC she underwent MRI of the lumbar spine and thoracic spine on 09/27/2021 and that showed discitis/osteomyelitis at L1-L2 with ventral epidural abscess at L1 compressing the thecal sac.  There was associated right psoas abscess at the level of L2.  Antibiotics were changed from Vanco, cefepime and Flagyl to vancomycin and meropenem.  Today she underwent aspiration of the abscess by IR.  Cultures have been sent.  Am asked to see the patient for antibiotic management.  Past Medical History:  Diagnosis Date   Hypertension   CKD Hyperlipidemia Prediabetes Obstructive sleep apnea Past Surgical History:  Procedure Laterality Date   ABDOMINAL HYSTERECTOMY    Cholecystectomy Social History   Socioeconomic History   Marital status: Widowed    Spouse name: Not on file   Number of children: Not on file   Years of education: Not on file   Highest education level: Not on file  Occupational History   Not on file   Tobacco Use   Smoking status: Never   Smokeless tobacco: Never  Substance and Sexual Activity   Alcohol use: No   Drug use: Not on file   Sexual activity: Not on file  Other Topics Concern   Not on file  Social History Narrative   Not on file   Social Determinants of Health   Financial Resource Strain: Not on file  Food Insecurity: Not on file  Transportation Needs: Not on file  Physical Activity: Not on file  Stress: Not on file  Social Connections: Not on file  Intimate Partner Violence: Not on file    History reviewed. No pertinent family history. Allergies  Allergen Reactions   Amlodipine     Other reaction(s): Dizziness   Cefdinir     Other reaction(s): Unknown   Ciprofloxacin     Other reaction(s): Unknown   Losartan     Other reaction(s): Angioedema   Nitrofurantoin Swelling   Other     UNKNOWN BLOOD PRESSURE MEDICATION   Carvedilol Rash   Clonidine Rash and Swelling   I? Current Facility-Administered Medications  Medication Dose Route Frequency Provider Last Rate Last Admin   0.9 %  sodium chloride infusion   Intravenous PRN Esaw GrandchildGriffith, Kelly A, DO   Stopped at 09/28/21 2317   acetaminophen (TYLENOL) tablet 650 mg  650 mg Oral Q6H PRN Esaw GrandchildGriffith, Kelly A, DO   650 mg at 09/30/21 1057   Chlorhexidine Gluconate Cloth 2 % PADS 6 each  6 each Topical Daily Esaw GrandchildGriffith, Kelly  A, DO   6 each at 09/29/21 1046   dextrose 5 % with KCl 20 mEq / L  infusion  20 mEq Intravenous Continuous Esaw Grandchild A, DO 75 mL/hr at 09/30/21 1104 20 mEq at 09/30/21 1104   diltiazem (CARDIZEM CD) 24 hr capsule 120 mg  120 mg Oral Daily Esaw Grandchild A, DO   120 mg at 09/30/21 0953   fentaNYL (SUBLIMAZE) 100 MCG/2ML injection            heparin injection 5,000 Units  5,000 Units Subcutaneous Q8H Alex Gardener T, NP       hydrALAZINE (APRESOLINE) injection 5 mg  5 mg Intravenous Q6H PRN Esaw Grandchild A, DO   5 mg at 09/26/21 1658   lidocaine (LIDODERM) 5 % 1 patch  1 patch Transdermal  Q24H Concha Se, MD   1 patch at 09/29/21 1649   MEDLINE mouth rinse  15 mL Mouth Rinse BID Esaw Grandchild A, DO   15 mL at 09/30/21 1102   meropenem (MERREM) 2 g in sodium chloride 0.9 % 100 mL IVPB  2 g Intravenous Q12H Derrek Gu, RPH 280 mL/hr at 09/30/21 0948 2 g at 09/30/21 0948   methocarbamol (ROBAXIN) 500 mg in dextrose 5 % 50 mL IVPB  500 mg Intravenous Q8H PRN Manuela Schwartz, NP   Stopped at 09/30/21 0428   midazolam (VERSED) 2 MG/2ML injection            nystatin (MYCOSTATIN/NYSTOP) topical powder   Topical BID Esaw Grandchild A, DO   Given at 09/30/21 1102   polyethylene glycol (MIRALAX / GLYCOLAX) packet 17 g  17 g Oral Daily Lurene Shadow, MD   17 g at 09/29/21 1050   senna-docusate (Senokot-S) tablet 1 tablet  1 tablet Oral QHS Lurene Shadow, MD   1 tablet at 09/29/21 2308   torsemide (DEMADEX) tablet 20 mg  20 mg Oral BID Esaw Grandchild A, DO   20 mg at 09/30/21 0953   vancomycin (VANCOREADY) IVPB 750 mg/150 mL  750 mg Intravenous Q48H Mila Merry A, RPH   Stopped at 09/30/21 0006     Abtx:  Anti-infectives (From admission, onward)    Start     Dose/Rate Route Frequency Ordered Stop   09/29/21 2200  vancomycin (VANCOREADY) IVPB 1250 mg/250 mL  Status:  Discontinued        1,250 mg 166.7 mL/hr over 90 Minutes Intravenous Every 48 hours 09/27/21 2156 09/29/21 1020   09/29/21 2200  vancomycin (VANCOREADY) IVPB 750 mg/150 mL        750 mg 150 mL/hr over 60 Minutes Intravenous Every 48 hours 09/29/21 1020     09/27/21 2200  meropenem (MERREM) 2 g in sodium chloride 0.9 % 100 mL IVPB        2 g 280 mL/hr over 30 Minutes Intravenous Every 12 hours 09/27/21 2036     09/27/21 2115  vancomycin (VANCOREADY) IVPB 1500 mg/300 mL        1,500 mg 150 mL/hr over 120 Minutes Intravenous  Once 09/27/21 2026 09/28/21 0148   09/25/21 1200  ceFEPIme (MAXIPIME) 2 g in sodium chloride 0.9 % 100 mL IVPB  Status:  Discontinued        2 g 200 mL/hr over 30 Minutes Intravenous  Every 24 hours 09/25/21 1018 09/27/21 1944   09/24/21 2000  ceFEPIme (MAXIPIME) 1 g in sodium chloride 0.9 % 100 mL IVPB  Status:  Discontinued        1  g 200 mL/hr over 30 Minutes Intravenous Every 24 hours 09/23/21 2100 09/25/21 1018   09/24/21 0800  metroNIDAZOLE (FLAGYL) IVPB 500 mg  Status:  Discontinued        500 mg 100 mL/hr over 60 Minutes Intravenous Every 12 hours 09/23/21 2014 09/24/21 1241   09/23/21 2100  vancomycin variable dose per unstable renal function (pharmacist dosing)  Status:  Discontinued         Does not apply See admin instructions 09/23/21 2100 09/24/21 1241   09/23/21 1930  ceFEPIme (MAXIPIME) 2 g in sodium chloride 0.9 % 100 mL IVPB        2 g 200 mL/hr over 30 Minutes Intravenous  Once 09/23/21 1921 09/23/21 2050   09/23/21 1930  metroNIDAZOLE (FLAGYL) IVPB 500 mg        500 mg 100 mL/hr over 60 Minutes Intravenous  Once 09/23/21 1921 09/23/21 2139   09/23/21 1930  vancomycin (VANCOCIN) IVPB 1000 mg/200 mL premix  Status:  Discontinued        1,000 mg 200 mL/hr over 60 Minutes Intravenous  Once 09/23/21 1921 09/23/21 1929   09/23/21 1930  vancomycin (VANCOREADY) IVPB 1500 mg/300 mL        1,500 mg 150 mL/hr over 120 Minutes Intravenous  Once 09/23/21 1929 09/23/21 2336       REVIEW OF SYSTEMS:  NA Objective:  VITALS:  BP 111/72 (BP Location: Left Arm)    Pulse 85    Temp 98.2 F (36.8 C) (Oral)    Resp 16    Ht 4\' 11"  (1.499 m)    Wt 69.8 kg    SpO2 98%    BMI 31.08 kg/m  PHYSICAL EXAM:  General: Patient is awake.  Minimally verbal.  Able to respond to some questions.  Follows some commands  Head: Normocephalic, without obvious abnormality, atraumatic. Eyes: Conjunctivae clear, anicteric sclerae. Pupils are equal ENT Nares normal. No drainage or sinus tenderness. Lips, mucosa, and tongue normal. No Thrush Neck:  symmetrical, no adenopathy, thyroid: non tender no carotid bruit and no JVD. Back: Did not examine Lungs: Bilateral air entry Heart:  Regular rate and rhythm, no murmur, rub or gallop. Abdomen: Soft, non-tender,not distended. Bowel sounds normal. No masses Extremities: atraumatic, no cyanosis. No edema. No clubbing Skin: No rashes or lesions. Or bruising Lymph: Cervical, supraclavicular normal. Neurologic: Grossly non-focal Pertinent Labs Lab Results CBC    Component Value Date/Time   WBC 21.0 (H) 09/30/2021 0457   RBC 4.15 09/30/2021 0457   HGB 12.6 09/30/2021 0457   HGB 15.2 03/24/2014 1031   HCT 39.1 09/30/2021 0457   HCT 45.5 03/24/2014 1031   PLT 419 (H) 09/30/2021 0457   PLT 225 03/24/2014 1031   MCV 94.2 09/30/2021 0457   MCV 93 03/24/2014 1031   MCH 30.4 09/30/2021 0457   MCHC 32.2 09/30/2021 0457   RDW 15.1 09/30/2021 0457   RDW 13.7 03/24/2014 1031   LYMPHSABS 1.3 09/28/2021 0405   LYMPHSABS 1.3 03/24/2014 1031   MONOABS 1.7 (H) 09/28/2021 0405   MONOABS 0.9 03/24/2014 1031   EOSABS 0.0 09/28/2021 0405   EOSABS 0.2 03/24/2014 1031   BASOSABS 0.1 09/28/2021 0405   BASOSABS 0.1 03/24/2014 1031    CMP Latest Ref Rng & Units 09/30/2021 09/29/2021 09/28/2021  Glucose 70 - 99 mg/dL 132(G122(H) 401(U131(H) 272(Z140(H)  BUN 8 - 23 mg/dL 36(U46(H) 44(I49(H) 34(V45(H)  Creatinine 0.44 - 1.00 mg/dL 4.25(Z1.15(H) 5.63(O1.23(H) 7.56(E1.24(H)  Sodium 135 - 145 mmol/L 148(H) 142 148(H)  Potassium 3.5 - 5.1 mmol/L 3.7 3.4(L) 4.0  Chloride 98 - 111 mmol/L 103 106 109  CO2 22 - 32 mmol/L 31 29 29   Calcium 8.9 - 10.3 mg/dL 9.1 8.9 9.3  Total Protein 6.5 - 8.1 g/dL 5.9(L) - -  Total Bilirubin 0.3 - 1.2 mg/dL 0.7 - -  Alkaline Phos 38 - 126 U/L 79 - -  AST 15 - 41 U/L 17 - -  ALT 0 - 44 U/L 13 - -      Microbiology: Recent Results (from the past 240 hour(s))  Resp Panel by RT-PCR (Flu A&B, Covid) Nasopharyngeal Swab     Status: None   Collection Time: 09/23/21  4:55 PM   Specimen: Nasopharyngeal Swab; Nasopharyngeal(NP) swabs in vial transport medium  Result Value Ref Range Status   SARS Coronavirus 2 by RT PCR NEGATIVE NEGATIVE Final    Comment:  (NOTE) SARS-CoV-2 target nucleic acids are NOT DETECTED.  The SARS-CoV-2 RNA is generally detectable in upper respiratory specimens during the acute phase of infection. The lowest concentration of SARS-CoV-2 viral copies this assay can detect is 138 copies/mL. A negative result does not preclude SARS-Cov-2 infection and should not be used as the sole basis for treatment or other patient management decisions. A negative result may occur with  improper specimen collection/handling, submission of specimen other than nasopharyngeal swab, presence of viral mutation(s) within the areas targeted by this assay, and inadequate number of viral copies(<138 copies/mL). A negative result must be combined with clinical observations, patient history, and epidemiological information. The expected result is Negative.  Fact Sheet for Patients:  11/21/21  Fact Sheet for Healthcare Providers:  BloggerCourse.com  This test is no t yet approved or cleared by the [employer] FDA and  has been authorized for detection and/or diagnosis of SARS-CoV-2 by FDA under an Emergency Use Authorization (EUA). This EUA will remain  in effect (meaning this test can be used) for the duration of the COVID-19 declaration under Section 564(b)(1) of the Act, 21 U.S.C.section 360bbb-3(b)(1), unless the authorization is terminated  or revoked sooner.       Influenza A by PCR NEGATIVE NEGATIVE Final   Influenza B by PCR NEGATIVE NEGATIVE Final    Comment: (NOTE) The Xpert Xpress SARS-CoV-2/FLU/RSV plus assay is intended as an aid in the diagnosis of influenza from Nasopharyngeal swab specimens and should not be used as a sole basis for treatment. Nasal washings and aspirates are unacceptable for Xpert Xpress SARS-CoV-2/FLU/RSV testing.  Fact Sheet for Patients: [place]  Fact Sheet for Healthcare  Providers: BloggerCourse.com  This test is not yet approved or cleared by the [employer] FDA and has been authorized for detection and/or diagnosis of SARS-CoV-2 by FDA under an Emergency Use Authorization (EUA). This EUA will remain in effect (meaning this test can be used) for the duration of the COVID-19 declaration under Section 564(b)(1) of the Act, 21 U.S.C. section 360bbb-3(b)(1), unless the authorization is terminated or revoked.  Performed at [facility], [address], [place], [place]   Urine Culture     Status: Abnormal   Collection Time: 09/23/21  6:35 PM   Specimen: Urine, Clean Catch  Result Value Ref Range Status   Specimen Description   Final    URINE, CLEAN CATCH Performed at [facility], [address], [place], [place]    Special Requests   Final    NONE Performed at [facility], [address] [place], [place]  Culture (A)  Final    <10,000 COLONIES/mL INSIGNIFICANT GROWTH Performed at [facility], [address], [place], [place] [address]    Report Status 09/25/2021 FINAL  Final  Blood culture (routine x 2)     Status: None   Collection Time: 09/23/21  6:35 PM   Specimen: BLOOD  Result Value Ref Range Status   Specimen Description BLOOD RIGHT ANTECUBITAL  Final   Special Requests   Final    BOTTLES DRAWN AEROBIC AND ANAEROBIC Blood Culture adequate volume   Culture   Final    NO GROWTH 5 DAYS Performed at [facility], [address] [place], [place] [address]    Report Status 09/28/2021 FINAL  Final  Blood culture (routine x 2)     Status: None   Collection Time: 09/23/21  6:36 PM   Specimen: BLOOD  Result Value Ref Range Status   Specimen Description BLOOD LEFT ANTECUBITAL  Final   Special Requests   Final    BOTTLES DRAWN AEROBIC AND ANAEROBIC Blood Culture results may not be optimal due to an inadequate volume of blood received in  culture bottles   Culture   Final    NO GROWTH 5 DAYS Performed at [facility], [address], [place], [place] [address]    Report Status 09/28/2021 FINAL  Final  Culture, blood (single) w Reflex to ID Panel     Status: None (Preliminary result)   Collection Time: 09/27/21  8:53 AM   Specimen: BLOOD  Result Value Ref Range Status   Specimen Description BLOOD BCAV  Final   Special Requests   Final    BOTTLES DRAWN AEROBIC AND ANAEROBIC BLOOD RIGHT HAND   Culture   Final    NO GROWTH 3 DAYS Performed at [facility], [address], [place], [place] [address]    Report Status PENDING  Incomplete    IMAGING RESULTS: Discitis/osteomyelitis at L1-2 with ventral epidural abscess at L1 compressing the thecal sac. Associated right psoas abscess at the level of L2, measuring up to 14 mm. I have personally reviewed the films ? Impression/Recommendation 88 year old female presenting with low back pain. L1-L2 acute osteomyelitis/discitis with right psoas abscess.  Status post aspiration and cultures have been sent.  Currently on vancomycin and meropenem.  Change the latter to ceftriaxone hold on vancomycin  AKI on CKD.  Due to urinary retention.  Much better after Foley placement  Acute urinary retention.  CT scan has showed underlying vaginal and rectal prolapse that can be contributing to that.  No evidence of urinary tract infection has UA did not show any WBCs.  Culture has been negative.   Hypertension on diltiazem, torsemide ? ___________________________________________________ Discussed current management with the daughters and hospitalist.  Note:  This document was prepared using Dragon voice recognition software and may include unintentional dictation errors.

## 2021-09-30 NOTE — Procedures (Signed)
Pre procedural Dx: Right sided paraspinal abscess Post procedural Dx: Same  Technically successful CT guided aspiration of 2 cc of purulent fluid from ill defined right paraspinal abscess.  EBL: None Complications: None immediate  Katherina Right, MD Pager #: [phone]

## 2021-09-30 NOTE — Progress Notes (Signed)
Physical Therapy Treatment Patient Details Name: Stacy Watts MRN: [id number] DOB: June 20, 1931 Today's Date: 09/30/2021   History of Present Illness 88 y.o. female who presented to [facility] 2/6 with back pain and increasing confusion. She was found to have severe sepsis secondary to acute UTI complicated by AKI and acute metabolic encephalopathy.  Imaging 2/10 showing "Discitis/osteomyelitis at L1-2 with ventral epidural abscess at  L1 compressing the thecal sac. Associated right psoas abscess at the  level of L2, measuring up to 14 mm ".  PMH significant for: HTN, vaginal/rectal prolapse, CKD stage IIIa, dementia, OSA.    PT Comments    Pt resting in bed upon therapy arrival (PT/OT co-treatment performed); pt agreeable to therapy session; pt's family present.  During session pt requiring 2 assist with bed mobility (via logrolling); CGA to min assist for sitting balance (about 10 minutes sitting edge of bed); max assist x2 to attempt standing x3 trials (pt unable to clear her hips from bed any of the trials); and total assist x2 to attempt lateral scooting towards L along bed x2 trials.  Pt assisted back to bed end of session and repositioned to improve comfort.  During session pt appearing tired and intermittently closing her eyes requiring cueing to stay alert.  Will continue to focus on strengthening, sitting balance, and progressive functional mobility per pt tolerance.   Recommendations for follow up therapy are one component of a multi-disciplinary discharge planning process, led by the attending physician.  Recommendations may be updated based on patient status, additional functional criteria and insurance authorization.  Follow Up Recommendations  Skilled nursing-short term rehab (<3 hours/day)     Assistance Recommended at Discharge Frequent or constant Supervision/Assistance  Patient can return home with the following Assistance with cooking/housework;Direct supervision/assist for  financial management;Assist for transportation;Two people to help with walking and/or transfers;Two people to help with bathing/dressing/bathroom;Direct supervision/assist for medications management;Help with stairs or ramp for entrance   Equipment Recommendations  Wheelchair (measurements PT);Wheelchair cushion (measurements PT);Hospital bed;Other (comment) (hoyer lift)    Recommendations for Other Services OT consult     Precautions / Restrictions Precautions Precautions: Fall Precaution Comments: Aspiration Restrictions Weight Bearing Restrictions: No     Mobility  Bed Mobility Overal bed mobility: Needs Assistance Bed Mobility: Supine to Sit, Sit to Supine Rolling: Max assist, +2 for physical assistance   Supine to sit: Max assist, +2 for physical assistance Sit to supine: Max assist, +2 for physical assistance   General bed mobility comments: +2 assist via logrolling; vc's and tactile cues for technique    Transfers Overall transfer level: Needs assistance Equipment used: 2 person hand held assist Transfers: Sit to/from Stand Sit to Stand: Max assist, +2 physical assistance (x3 trials)          Lateral/Scoot Transfers: Total assist, +2 physical assistance (x2 trials) General transfer comment: Unable to clear pt's hips from bed for sit to/from stand trials with max assist x2 (use of bed sheet under pt's bottom to assist with standing); total assist x2 to laterally scoot towards head of bed    Ambulation/Gait               General Gait Details: not appropriate at this time   Stairs             Wheelchair Mobility    Modified Rankin (Stroke Patients Only)       Balance Overall balance assessment: Needs assistance Sitting-balance support: Single extremity supported, Feet supported Sitting balance-Leahy Scale: [place] Sitting  balance - Comments: CGA to min assist for sitting balance     Standing balance-Leahy Scale: Zero Standing balance  comment: unable to stand with 2 assist                            Cognition Arousal/Alertness: Lethargic Behavior During Therapy: Anxious Overall Cognitive Status: History of cognitive impairments - at baseline                                 General Comments: Pt oriented to self only.  Pt requiring cueing to follow 1 step commands (complicated d/t pt lethargic during session).        Exercises      General Comments General comments (skin integrity, edema, etc.): R>L LE swelling noted      Pertinent Vitals/Pain Pain Assessment Pain Assessment: Faces Pain Score: 10-Worst pain ever Pain Location: back, sacrum Pain Descriptors / Indicators: Guarding, Grimacing Pain Intervention(s): Limited activity within patient's tolerance, Monitored during session, Repositioned Vitals (HR and O2 on room air) stable and WFL throughout treatment session.    Home Living                          Prior Function            PT Goals (current goals can now be found in the care plan section) Acute Rehab PT Goals Patient Stated Goal: to improve pain and mobility PT Goal Formulation: With patient/family Time For Goal Achievement: 10/08/21 Potential to Achieve Goals: Fair Progress towards PT goals: Progressing toward goals    Frequency    Min 2X/week      PT Plan Current plan remains appropriate    Co-evaluation PT/OT/SLP Co-Evaluation/Treatment: Yes Reason for Co-Treatment: Necessary to address cognition/behavior during functional activity;For patient/therapist safety;To address functional/ADL transfers PT goals addressed during session: Mobility/safety with mobility OT goals addressed during session: ADL's and self-care      AM-PAC PT "6 Clicks" Mobility   Outcome Measure  Help needed turning from your back to your side while in a flat bed without using bedrails?: Total Help needed moving from lying on your back to sitting on the side of a  flat bed without using bedrails?: Total Help needed moving to and from a bed to a chair (including a wheelchair)?: Total Help needed standing up from a chair using your arms (e.g., wheelchair or bedside chair)?: Total Help needed to walk in hospital room?: Total Help needed climbing 3-5 steps with a railing? : Total 6 Click Score: 6    End of Session Equipment Utilized During Treatment: Oxygen Activity Tolerance: Patient limited by fatigue;Patient limited by lethargy Patient left: in bed;with call bell/phone within reach;with bed alarm set;with family/visitor present;Other (comment) (B heels floating via pillow support) Nurse Communication: Mobility status;Precautions PT Visit Diagnosis: Other abnormalities of gait and mobility (R26.89);Muscle weakness (generalized) (M62.81);History of falling (Z91.81);Pain Pain - part of body:  (mid low back)     Time: [id number] PT Time Calculation (min) (ACUTE ONLY): 33 min  Charges:  $Therapeutic Activity: 8-22 mins                    Leitha Bleak, PT 09/30/21, 1:49 PM

## 2021-09-30 NOTE — Progress Notes (Addendum)
Speech Language Pathology Treatment:    Patient Details Name: Stacy Watts MRN: [id number] DOB: Nov 06, 1930 Today's Date: 09/30/2021 Time: 5784-6962 SLP Time Calculation (min) (ACUTE ONLY): 15 min  Assessment / Plan / Recommendation Clinical Impression  Pt/family seen for f/u education and diet tolerance. Pt alert, pleasant, and cooperative. Limited verbalizations. Nodding head/shaking head no in response to questions. Daughter, Stacy Watts, at bedside.   Reviewed diet recommendations, safe swallowing strategies/aspirations precautions, and SLP POC with pt and daughter. Pt nodding in agreement, ?full understanding. Daughter verbalized understanding/agreement. Daughter noted pt "did well" with dinner last night with pureed consistencies and thin liquids via teaspoon. No overt s/sx pharyngeal dysphagia endorsed by daughter with meal or with meds crushed in applesauce. Daughter noted pt continues to take extra time to retrieve boluses from teaspoon, and pt continues to require 1:1 assistance with feeding. PO trials deferred today at pt NPO for lumbar disc aspiration.   Pt remains at increased risk for aspiration/aspiration PNA given AMS, multiple medical comorbidities, and dependence for feeding.   Recommend continuation of pureed diet with thin liquids via teaspoon only and safe swallowing strategies/aspiration precautions as outlined below.  SLP to f/u per POC for diet tolerance and trials of upgraded consistencies, as appropriate.     HPI HPI: Per admitting H&P "Stacy Watts is a 88 y.o. female seen in the hospital today with complaints of back pain.  Patient has already been started on treatment for urinary tract infection however her symptoms including her back pain which is in the middle of the lower back is gotten worse patient denies any neurological symptoms incontinence or tingling.  Patient was started on Bactrim.  HPI is limited secondary to patient's dementia." Family notified MD  of coughing with thin liquids, thus warrenting swallow eval.      SLP Plan  Continue with current plan of care      Recommendations for follow up therapy are one component of a multi-disciplinary discharge planning process, led by the attending physician.  Recommendations may be updated based on patient status, additional functional criteria and insurance authorization.    Recommendations  Diet recommendations: Dysphagia 1 (puree);Thin liquid (via teaspoon) Liquids provided via: Teaspoon Medication Administration: Crushed with puree Supervision: Staff to assist with self feeding;Full supervision/cueing for compensatory strategies Compensations: Slow rate;Small sips/bites (ALERT/AWAKE, Allow pt time to retrieve boluses from teaspoon) Postural Changes and/or Swallow Maneuvers: Seated upright 90 degrees;Upright 30-60 min after meal                Oral Care Recommendations: Oral care BID;Oral care before and after PO Follow Up Recommendations: Skilled nursing-short term rehab (<3 hours/day) Assistance recommended at discharge: Frequent or constant Supervision/Assistance SLP Visit Diagnosis: Dysphagia, oropharyngeal phase (R13.12) Plan: Continue with current plan of care          Clyde Canterbury, M.S., CCC-SLP Speech-Language Pathologist [facility] [phone] (ASCOM)   Woodroe Chen  09/30/2021, 10:14 AM

## 2021-09-30 NOTE — Progress Notes (Addendum)
Daily Progress Note   Patient Name: Stacy Watts       Date: 09/30/2021 DOB: 09/28/1930  Age: 88 y.o. MRN#: [id number] Attending Physician: Ezekiel Slocumb, DO Primary Care Physician: Kirk Ruths, MD Admit Date: 09/23/2021  Reason for Consultation/Follow-up: Establishing goals of care  Subjective: Patient is in bed. She is restless and complaining for pain. Her daughters are at bedside. They have been updated and are able to articulate her diagnoses and plans. They discuss current plans for aspiration to determine management. Phone rang, daughter advises it is radiology calling, and she advises radiology they are speaking with PMT. Conversation continued; both daughters speaking with radiology answering questions. I stepped out. Will follow up tomorrow.  Would recommend initiating Norco half to 1 pill (2.5-5mg ) q 4-6hrs PRN for pain.    Length of Stay: 7  Current Medications: Scheduled Meds:   Chlorhexidine Gluconate Cloth  6 each Topical Daily   diltiazem  120 mg Oral Daily   heparin  5,000 Units Subcutaneous Q8H   lidocaine  1 patch Transdermal Q24H   mouth rinse  15 mL Mouth Rinse BID   nystatin   Topical BID   polyethylene glycol  17 g Oral Daily   senna-docusate  1 tablet Oral QHS   torsemide  20 mg Oral BID    Continuous Infusions:  sodium chloride Stopped (09/28/21 2317)   dextrose 5 % with KCl 20 mEq / L 20 mEq (09/30/21 1104)   meropenem (MERREM) IV 2 g (09/30/21 0948)   methocarbamol (ROBAXIN) IV Stopped (09/30/21 0428)   vancomycin Stopped (09/30/21 0006)    PRN Meds: sodium chloride, acetaminophen, hydrALAZINE, methocarbamol (ROBAXIN) IV  Physical Exam Pulmonary:     Effort: Pulmonary effort is normal.  Neurological:     Mental Status: She is  alert.            Vital Signs: BP 133/70    Pulse 80    Temp 98.2 F (36.8 C) (Oral)    Resp 18    Ht 4\' 11"  (1.499 m)    Wt 69.8 kg    SpO2 95%    BMI 31.08 kg/m  SpO2: SpO2: 95 % O2 Device: O2 Device: Nasal Cannula O2 Flow Rate: O2 Flow Rate (L/min): 2 L/min  Intake/output summary:  Intake/Output Summary (Last 24  hours) at 09/30/2021 1124 Last data filed at 09/30/2021 [id number] Gross per 24 hour  Intake 2194.97 ml  Output 3400 ml  Net -1205.03 ml   LBM: Last BM Date: 09/30/21 Baseline Weight: Weight: 72.6 kg Most recent weight: Weight: 69.8 kg        Patient Active Problem List   Diagnosis Date Noted   Dysphagia 09/29/2021   Hypokalemia 09/29/2021   Vertebral osteomyelitis ([place]) 09/28/2021   Abscess in epidural space of lumbar spine 09/28/2021   Psoas abscess, right ([place]) 09/28/2021   Fever 09/27/2021   Hypernatremia 09/27/2021   Urinary retention 09/27/2021   Pressure injury of skin 09/27/2021   NSVT (nonsustained ventricular tachycardia) 09/27/2021   Intertrigo 0000000   Acute metabolic encephalopathy [id number]   Back pain 09/25/2021   UTI (urinary tract infection) 09/23/2021   Bilateral hydronephrosis 09/23/2021   HTN (hypertension) 09/23/2021   AKI (acute kidney injury) ([place]) 09/23/2021   Hyponatremia 09/23/2021   Sepsis secondary to UTI ([place]) 09/23/2021   Intractable headache 01/24/2017   CKD (chronic kidney disease) stage 3, GFR 30-59 ml/min ([place]) 04/03/2014   OSA (obstructive sleep apnea) 04/03/2014    Palliative Care Assessment & Plan    Recommendations/Plan: Would recommend initiating Norco half - 1 pill (2.5-5mg ) q 4-6hrs PRN for pain.    Family preparing for procedure and speaking with radiology. I will follow up tomorrow.      Code Status:    Code Status Orders  (From admission, onward)           Start     Ordered   09/23/21 2013  Full code  Continuous        09/23/21 2014           Code Status History     Date Active Date  Inactive Code Status Order ID Comments User Context   01/25/2017 0059 01/25/2017 1916 Full Code [id number]  Hugelmeyer, Ubaldo Glassing, DO Inpatient       Thank you for allowing the Palliative Medicine Team to assist in the care of this patient.       Total Time  25 min 25 min Prolonged Time Billed  no       Message sent to attending regarding recommendations.    Greater than 50%  of this time was spent counseling and coordinating care related to the above assessment and plan.  Asencion Gowda, NP  Please contact Palliative Medicine Team phone at [phone] for questions and concerns.

## 2021-10-01 DIAGNOSIS — Z7189 Other specified counseling: Secondary | ICD-10-CM | POA: Diagnosis not present

## 2021-10-01 DIAGNOSIS — A419 Sepsis, unspecified organism: Secondary | ICD-10-CM | POA: Diagnosis not present

## 2021-10-01 DIAGNOSIS — N390 Urinary tract infection, site not specified: Secondary | ICD-10-CM | POA: Diagnosis not present

## 2021-10-01 LAB — BASIC METABOLIC PANEL
Anion gap: 8 (ref 5–15)
BUN: 45 mg/dL — ABNORMAL HIGH (ref 8–23)
CO2: 34 mmol/L — ABNORMAL HIGH (ref 22–32)
Calcium: 8.9 mg/dL (ref 8.9–10.3)
Chloride: 102 mmol/L (ref 98–111)
Creatinine, Ser: 1.16 mg/dL — ABNORMAL HIGH (ref 0.44–1.00)
GFR, Estimated: 45 mL/min — ABNORMAL LOW (ref >60)
Glucose, Bld: 141 mg/dL — ABNORMAL HIGH (ref 70–99)
Potassium: 3.5 mmol/L (ref 3.5–5.1)
Sodium: 144 mmol/L (ref 135–145)

## 2021-10-01 LAB — CBC
HCT: 39.1 % (ref 36.0–46.0)
Hemoglobin: 12.4 g/dL (ref 12.0–15.0)
MCH: 30.2 pg (ref 26.0–34.0)
MCHC: 31.7 g/dL (ref 30.0–36.0)
MCV: 95.1 fL (ref 80.0–100.0)
Platelets: 437 10*3/uL — ABNORMAL HIGH (ref 150–400)
RBC: 4.11 MIL/uL (ref 3.87–5.11)
RDW: 15.1 % (ref 11.5–15.5)
WBC: 21 10*3/uL — ABNORMAL HIGH (ref 4.0–10.5)
nRBC: 0 % (ref 0.0–0.2)

## 2021-10-01 LAB — C-REACTIVE PROTEIN: CRP: 1.8 mg/dL — ABNORMAL HIGH (ref <1.0)

## 2021-10-01 LAB — MAGNESIUM: Magnesium: 2.1 mg/dL (ref 1.7–2.4)

## 2021-10-01 LAB — SEDIMENTATION RATE: Sed Rate: 36 mm/hr — ABNORMAL HIGH (ref 0–30)

## 2021-10-01 LAB — PROCALCITONIN: Procalcitonin: 1.6 ng/mL

## 2021-10-01 MED ORDER — MORPHINE SULFATE (PF) 2 MG/ML IV SOLN
2.0000 mg | INTRAVENOUS | Status: DC | PRN
  Filled 2021-10-01: qty 1

## 2021-10-01 MED ORDER — HYDROCODONE-ACETAMINOPHEN 5-325 MG PO TABS
1.0000 | ORAL_TABLET | ORAL | Status: DC | PRN
  Administered 2021-10-01 – 2021-10-02 (×6): 1 via ORAL
  Filled 2021-10-01 (×6): qty 1

## 2021-10-01 NOTE — Progress Notes (Signed)
Physical Therapy Treatment Patient Details Name: Stacy Watts MRN: [id number] DOB: [birth year] Today's Date: 10/01/2021   History of Present Illness 88 y.o. female who presented to [facility] 2/6 with back pain and increasing confusion. She was found to have severe sepsis secondary to acute UTI complicated by AKI and acute metabolic encephalopathy.  Imaging 2/10 showing "Discitis/osteomyelitis at L1-2 with ventral epidural abscess at  L1 compressing the thecal sac. Associated right psoas abscess at the  level of L2, measuring up to 14 mm ".  PMH significant for: HTN, vaginal/rectal prolapse, CKD stage IIIa, dementia, OSA.    PT Comments    Pt seen this am, pre-medicated prior to session. Pt with poor tolerance for P/AAROM B LE's due to causing increased lower back pain.  Pt assisted to edge of bed slowly without twisting with MaxA.  Sitting EOB x 6 minutes with MinA to maintain, 5/10 pain lower back. MaxA to return to supine and reposition to comfort.  Will continue to progress as tolerated. Continue to recommend SNF once medically stable.    Recommendations for follow up therapy are one component of a multi-disciplinary discharge planning process, led by the attending physician.  Recommendations may be updated based on patient status, additional functional criteria and insurance authorization.  Follow Up Recommendations  Skilled nursing-short term rehab (<3 hours/day)     Assistance Recommended at Discharge Frequent or constant Supervision/Assistance  Patient can return home with the following Assistance with cooking/housework;Direct supervision/assist for financial management;Assist for transportation;Two people to help with walking and/or transfers;Two people to help with bathing/dressing/bathroom;Direct supervision/assist for medications management;Help with stairs or ramp for entrance   Equipment Recommendations  Wheelchair (measurements PT);Wheelchair cushion (measurements PT);Hospital  bed;Other (comment)    Recommendations for Other Services OT consult     Precautions / Restrictions Precautions Precautions: Fall Precaution Comments: Aspiration Restrictions Weight Bearing Restrictions: No Other Position/Activity Restrictions:  (Log Roll)     Mobility  Bed Mobility Overal bed mobility: Needs Assistance Bed Mobility: Supine to Sit, Sit to Supine Rolling: Max assist   Supine to sit: Max assist Sit to supine: Max assist   General bed mobility comments: Very slow movements and log rolling to prevent increase in pain    Transfers                        Ambulation/Gait                   Stairs             Wheelchair Mobility    Modified Rankin (Stroke Patients Only)       Balance Overall balance assessment: Needs assistance Sitting-balance support: Single extremity supported, Feet supported Sitting balance-Leahy Scale: Fair Sitting balance - Comments: MinA to maintain balance Postural control: Posterior lean                                  Cognition Arousal/Alertness: Lethargic Behavior During Therapy: WFL for tasks assessed/performed Overall Cognitive Status: History of cognitive impairments - at baseline                                 General Comments:  (Willing to try mobility)        Exercises      General Comments General comments (skin integrity, edema, etc.): Skin on back looks  good, no redness or breakdown noted. Pt assisted with repositioning to comfort      Pertinent Vitals/Pain Pain Assessment Pain Assessment: 0-10 Pain Score: 5  Pain Location:  (Lower back) Pain Descriptors / Indicators: Guarding, Grimacing Pain Intervention(s): Premedicated before session    Home Living                          Prior Function            PT Goals (current goals can now be found in the care plan section) Acute Rehab PT Goals Patient Stated Goal: to improve pain and  mobility    Frequency    Min 2X/week      PT Plan Current plan remains appropriate    Co-evaluation              AM-PAC PT "6 Clicks" Mobility   Outcome Measure  Help needed turning from your back to your side while in a flat bed without using bedrails?: Total Help needed moving from lying on your back to sitting on the side of a flat bed without using bedrails?: Total Help needed moving to and from a bed to a chair (including a wheelchair)?: Total Help needed standing up from a chair using your arms (e.g., wheelchair or bedside chair)?: Total Help needed to walk in hospital room?: Total Help needed climbing 3-5 steps with a railing? : Total 6 Click Score: 6    End of Session   Activity Tolerance: Patient limited by fatigue;Patient limited by lethargy Patient left: in bed;with call bell/phone within reach;with bed alarm set;with family/visitor present;Other (comment) Nurse Communication: Mobility status;Precautions PT Visit Diagnosis: Other abnormalities of gait and mobility (R26.89);Muscle weakness (generalized) (M62.81);History of falling (Z91.81);Pain Pain - part of body:  (lower back)     Time: 1036-1100 PT Time Calculation (min) (ACUTE ONLY): 24 min  Charges:  $Therapeutic Activity: 23-37 mins          Mikel Cella, PTA    Josie Dixon 10/01/2021, 11:39 AM

## 2021-10-01 NOTE — Progress Notes (Signed)
Progress Note   Patient: Stacy Watts [id number] DOB: [birth year] DOA: 09/23/2021     8 DOS: the patient was seen and examined on 10/01/2021   Brief hospital course: Stacy Watts is a 88 y.o. female with medical history significant for CKD stage IIIa, hypertension, dementia, OSA. She was brought to the hospital because of back pain and increasing confusion.  She was recently treated for UTI with a 3-day course of Bactrim (started Bactrim on 09/19/2021).    She was found to have severe sepsis secondary to acute UTI complicated by AKI and acute metabolic encephalopathy.  She was treated with IV fluids and empiric IV antibiotics.  Assessment and Plan: * Sepsis secondary to UTI [facility])- (present on admission) Presented with leukocytosis, tachycardia, AKI and lactic acidosis consistent with organ dysfunction.  Treated per protocol on admission.  Continue IV antibiotics and follow cultures.  Infection source is  spinal infection.  UTI (urinary tract infection)- (present on admission) Due to anatomical urinary retention in the setting of vaginal and rectal prolapse. -- Foley discontinued but pt had ongoing retention.   Replaced Foley --Cefepime initially >> Merrem and Vanc on PM of 2/10 given spinal infection seen on MRI's --Abx per ID.  Now on Rocephin   Back pain- (present on admission) Due to osteomyelitis / discitis / epidural abscess as seen on MRI's 09/27/21.  2/14: pain uncontrolled, increased Norco dose --Robaxin and Norco as needed -- Tylenol as needed --Stopped low-dose gabapentin, too sedating although seemed to have some improvement in her pain -- PT/OT  Hyponatremia- (present on admission) Resolved.  Present on admission, acute.   Likely hypovolemic in the setting of poor p.o. intake.  Presented with sodium 129, improved with IV fluids.  Monitor BMP.  AKI (acute kidney injury) ([place])- (present on admission) Presented with creatinine 3.45, baseline appears around  1.2.  Foley was placed on admission due to obstructive uropathy secondary to vaginal and rectal prolapse. Improved with IV fluids and Foley. Foley had to be replaced, unsuccessful voiding trial 2/9-2/10, Foley replaced. Cr now at baseline.   Monitor BMP.  Acute metabolic encephalopathy- (present on admission) Persistent.  Patient has been lethargic for past few days.  AMS was present on admission.   Continue treating infection as outlined.  Supportive care / delirium precautions.  2/14: Pt remains drowsy & somnolent, waxes and wanes.  Responds appropriately when awake.  Bilateral hydronephrosis- (present on admission) Due to pelvic floor laxity causing vaginal and rectal prolapse.    Voiding trial attempted, still with ongoing retention.   Foley replaced.  Outpatient follow-up with urology and/or gynecology.   HTN (hypertension)- (present on admission) Blood pressures have been elevated, possibly related to pain. Continue torsemide  As needed hydralazine  Added diltiazem 120 mg daily, stopped today due to softer BP's with pain med Titrate antihypertensives  OSA (obstructive sleep apnea)- (present on admission) CPAP ordered   Hypokalemia Resolved.  K 3.5 today. Remains on D5w + Kcl with ongoing poor PO intake.  Monitor and replace K as needed.   Dysphagia SLP for swallow evaluation. On pureed diet with thickened liquids. Follow up SLP recommendations.  Psoas abscess, right ([place])- (present on admission) Mgmt as outlined.    Abscess in epidural space of lumbar spine- (present on admission) Seen on MRI's of thoracic and lumbar spine 09/27/21.  Mgmt as outlined.  Vertebral osteomyelitis ([place])- (present on admission) Seen on MRI's of thoracic and lumbar spine 09/27/21, involves L1-L2.  IR did CT-guided aspiration on 2/13.  Follow cultures. ID consulted. Continue Rocephin. Neurosurgery consulted and did offer surgery, family decline given patient's high risk of complications  or poor outcome.      Intertrigo- (present on admission) Patient has erythema under bilateral breasts, appears consistent with candidal intertrigo. -- Nystatin powder -- Monitor closely  NSVT (nonsustained ventricular tachycardia) Central tele reported 9 beat run of VT on 2/10 at 1252.  Pt was reportedly sleeping at the time.   --Telemetry  --Monitor and replace K and Mg  Pressure injury of skin Present on admission.  I agree with the wound description as outlined below. -- Frequently reposition patient -- Local wound care --Monitor closely for signs of infection  Pressure Injury 09/27/21 Sacrum Mid Stage 3 -  Full thickness tissue loss. Subcutaneous fat may be visible but bone, tendon or muscle are NOT exposed. hole (Active)  09/27/21 1033  Location: Sacrum  Location Orientation: Mid  Staging: Stage 3 -  Full thickness tissue loss. Subcutaneous fat may be visible but bone, tendon or muscle are NOT exposed.  Wound Description (Comments): hole  Present on Admission:         Urinary retention- (present on admission) Foley placed on admission was d/c'd for voiding trial, failed.  She has structural issues related to pelvic organ prolapse causing urinary retention. --Replaced Foley --Likely will need chronic Foley until prolapse issues can be addressed  Hypernatremia Resolved with D5w started on 2/10. Sodium 144 this morning.  Patient has been lethargic and mostly sleeping, very limited p.o. intake -- On D5w with KCl additive  -- Encourage oral hydration -- Monitor BMP  Fever Pt spiked fever 101.1 F despite abx for UTI.  MRI of T/L spine showed osteo/discitis and epidural abscess.   Fever resolved after broadened abx.  Abx per ID. Monitor fever curve and CBC.  CKD (chronic kidney disease) stage 3, GFR 30-59 ml/min ([facility])- (present on admission) Baseline creatinine appears around 1.2.  Presented with AKI as outlined. Monitor renal function. Creatinine today 1.16  stable, at baseline.        Subjective: Pt sleeping but had been awake and worked with therapy earlier.  Still sleeping a lot.  Back pain has not been controlled on Norco 2.5 mg, dose increased.  Pt denies complaints except she does wince in pain a couple of times during the encounter.  No other acute events reported.  Physical Exam: Vitals:   09/30/21 1530 09/30/21 1600 09/30/21 1925 10/01/21 0824  BP: 128/73 130/70 (!) 111/54 (!) 119/54  Pulse: 92 90 84 (!) 55  Resp:   16   Temp:  98.2 F (36.8 C) (!) 97.5 F (36.4 C) 97.8 F (36.6 C)  TempSrc:  Oral    SpO2: 98% 98% 92% 94%  Weight:      Height:       General exam: somnolent but responsive, no acute distress HEENT: moist mucus membranes, hearing grossly normal  Respiratory system: CTAB, no wheezes, rales or rhonchi, normal respiratory effort. Cardiovascular system: normal S1/S2, RRR, nearly resolved LE edema.   Gastrointestinal system: soft, NT, ND, no HSM felt, +bowel sounds. Central nervous system: no gross focal neurologic deficits Skin: dry, intact, normal temperature Psychiatry: unable to evaluate due to somnolence, mood appears normal when briefly awake   Data Reviewed:  Labs reviewed and notable for CO2 34, BUN 45, Cr 1.16, CRP 1.8, procal 1.60, Plt 437k, WBC unchanged 21.0 .  Abscess culture negative to date < 12 hours growth  Family Communication: daughter at bedside  on rounds  Disposition: Status is: Inpatient Remains inpatient appropriate because: severity of illness, on IV fluids and IV antibiotics, cultures pending, persistent encephalopathy, inadequate PO intake, requires SNF placement.           Planned Discharge Destination: Skilled nursing facility     Time spent: 35 minutes  Author: Ezekiel Slocumb, DO 10/01/2021 4:07 PM  For on call review [email or web address].

## 2021-10-01 NOTE — Progress Notes (Signed)
Daily Progress Note   Patient Name: Stacy Watts       Date: 10/01/2021 DOB: October 19, 1930  Age: 88 y.o. MRN#: [id number] Attending Physician: Ezekiel Slocumb, DO Primary Care Physician: Kirk Ruths, MD Admit Date: 09/23/2021  Reason for Consultation/Follow-up: Establishing goals of care  Subjective: Patient is resting in bed sleeping. Daughter is at bedside. She is relieved patient's pain is controlled and she is able to sleep. Family has been hopeful patient could have Sabin conversations herself. During my previous visits she has been asleep or in a great amount of pain.    Daughter discusses that she is waiting to find out definitive options in care to plan moving forward.   Discussed that I would return in the morning and we could all talk regarding code status and boundaries on care. There are 3/5 living children.   Length of Stay: 8  Current Medications: Scheduled Meds:   Chlorhexidine Gluconate Cloth  6 each Topical Daily   heparin  5,000 Units Subcutaneous Q8H   lidocaine  1 patch Transdermal Q24H   mouth rinse  15 mL Mouth Rinse BID   nystatin   Topical BID   polyethylene glycol  17 g Oral Daily   senna-docusate  1 tablet Oral QHS   torsemide  20 mg Oral BID    Continuous Infusions:  sodium chloride Stopped (09/28/21 2317)   cefTRIAXone (ROCEPHIN)  IV 2 g (10/01/21 1004)   dextrose 5 % with KCl 20 mEq / L 20 mEq (10/01/21 1437)   methocarbamol (ROBAXIN) IV Stopped (09/30/21 0428)    PRN Meds: sodium chloride, acetaminophen, hydrALAZINE, HYDROcodone-acetaminophen, methocarbamol (ROBAXIN) IV, morphine injection  Physical Exam Constitutional:      Comments: Eyes closed. Asleep.   Pulmonary:     Effort: Pulmonary effort is normal.            Vital  Signs: BP (!) 119/54 (BP Location: Right Arm)    Pulse (!) 55    Temp 97.8 F (36.6 C)    Resp 16    Ht 4\' 11"  (1.499 m)    Wt 69.8 kg    SpO2 94%    BMI 31.08 kg/m  SpO2: SpO2: 94 % O2 Device: O2 Device: Nasal Cannula O2 Flow Rate: O2 Flow Rate (L/min): 2 L/min  Intake/output summary:  Intake/Output Summary (Last 24 hours) at 10/01/2021 1542 Last data filed at 10/01/2021 1018 Gross per 24 hour  Intake --  Output 600 ml  Net -600 ml   LBM: Last BM Date : 09/30/21 Baseline Weight: Weight: 72.6 kg Most recent weight: Weight: 69.8 kg   Patient Active Problem List   Diagnosis Date Noted   Dysphagia 09/29/2021   Hypokalemia 09/29/2021   Vertebral osteomyelitis ([place]) 09/28/2021   Abscess in epidural space of lumbar spine 09/28/2021   Psoas abscess, right ([place]) 09/28/2021   Fever 09/27/2021   Hypernatremia 09/27/2021   Urinary retention 09/27/2021   Pressure injury of skin 09/27/2021   NSVT (nonsustained ventricular tachycardia) 09/27/2021   Intertrigo 0000000   Acute metabolic encephalopathy [id number]   Back pain 09/25/2021   UTI (urinary tract infection) 09/23/2021   Bilateral hydronephrosis 09/23/2021   HTN (hypertension) 09/23/2021   AKI (acute kidney injury) ([place]) 09/23/2021   Hyponatremia 09/23/2021   Sepsis secondary to UTI ([place]) 09/23/2021   Intractable headache 01/24/2017   CKD (chronic kidney disease) stage 3, GFR 30-59 ml/min ([place]) 04/03/2014   OSA (obstructive sleep apnea) 04/03/2014    Palliative Care Assessment & Plan    Recommendations/Plan: Will return in the morning to discuss any boundaries on care.   Code Status:    Code Status Orders  (From admission, onward)           Start     Ordered   09/23/21 2013  Full code  Continuous        09/23/21 2014           Code Status History     Date Active Date Inactive Code Status Order ID Comments User Context   01/25/2017 0059 01/25/2017 1916 Full Code [id number]  Hugelmeyer, Ubaldo Glassing, DO  Inpatient     Thank you for allowing the Palliative Medicine Team to assist in the care of this patient.       Total Time 25 min Prolonged Time Billed  no       Greater than 50%  of this time was spent counseling and coordinating care related to the above assessment and plan.  Asencion Gowda, NP  Please contact Palliative Medicine Team phone at [phone] for questions and concerns.

## 2021-10-02 DIAGNOSIS — L304 Erythema intertrigo: Secondary | ICD-10-CM

## 2021-10-02 DIAGNOSIS — Z7189 Other specified counseling: Secondary | ICD-10-CM | POA: Diagnosis not present

## 2021-10-02 DIAGNOSIS — N183 Chronic kidney disease, stage 3 unspecified: Secondary | ICD-10-CM

## 2021-10-02 DIAGNOSIS — A419 Sepsis, unspecified organism: Secondary | ICD-10-CM

## 2021-10-02 DIAGNOSIS — N390 Urinary tract infection, site not specified: Secondary | ICD-10-CM | POA: Diagnosis not present

## 2021-10-02 DIAGNOSIS — E876 Hypokalemia: Secondary | ICD-10-CM

## 2021-10-02 DIAGNOSIS — R1310 Dysphagia, unspecified: Secondary | ICD-10-CM

## 2021-10-02 DIAGNOSIS — E870 Hyperosmolality and hypernatremia: Secondary | ICD-10-CM

## 2021-10-02 DIAGNOSIS — I4729 Other ventricular tachycardia: Secondary | ICD-10-CM

## 2021-10-02 DIAGNOSIS — G9341 Metabolic encephalopathy: Secondary | ICD-10-CM

## 2021-10-02 DIAGNOSIS — I10 Essential (primary) hypertension: Secondary | ICD-10-CM

## 2021-10-02 DIAGNOSIS — G4733 Obstructive sleep apnea (adult) (pediatric): Secondary | ICD-10-CM

## 2021-10-02 LAB — BASIC METABOLIC PANEL
Anion gap: 6 (ref 5–15)
BUN: 40 mg/dL — ABNORMAL HIGH (ref 8–23)
CO2: 34 mmol/L — ABNORMAL HIGH (ref 22–32)
Calcium: 8.7 mg/dL — ABNORMAL LOW (ref 8.9–10.3)
Chloride: 99 mmol/L (ref 98–111)
Creatinine, Ser: 1.36 mg/dL — ABNORMAL HIGH (ref 0.44–1.00)
GFR, Estimated: 37 mL/min — ABNORMAL LOW (ref >60)
Glucose, Bld: 129 mg/dL — ABNORMAL HIGH (ref 70–99)
Potassium: 3.9 mmol/L (ref 3.5–5.1)
Sodium: 139 mmol/L (ref 135–145)

## 2021-10-02 LAB — PROCALCITONIN: Procalcitonin: 1.13 ng/mL

## 2021-10-02 LAB — CBC
HCT: 37 % (ref 36.0–46.0)
Hemoglobin: 11.8 g/dL — ABNORMAL LOW (ref 12.0–15.0)
MCH: 29.9 pg (ref 26.0–34.0)
MCHC: 31.9 g/dL (ref 30.0–36.0)
MCV: 93.9 fL (ref 80.0–100.0)
Platelets: 393 10*3/uL (ref 150–400)
RBC: 3.94 MIL/uL (ref 3.87–5.11)
RDW: 14.9 % (ref 11.5–15.5)
WBC: 19.4 10*3/uL — ABNORMAL HIGH (ref 4.0–10.5)
nRBC: 0 % (ref 0.0–0.2)

## 2021-10-02 LAB — CULTURE, BLOOD (SINGLE): Culture: NO GROWTH

## 2021-10-02 LAB — MAGNESIUM: Magnesium: 2 mg/dL (ref 1.7–2.4)

## 2021-10-02 MED ORDER — HALOPERIDOL 0.5 MG PO TABS
0.5000 mg | ORAL_TABLET | ORAL | Status: DC | PRN
  Filled 2021-10-02: qty 1

## 2021-10-02 MED ORDER — HYDROCODONE-ACETAMINOPHEN 5-325 MG PO TABS
1.0000 | ORAL_TABLET | ORAL | Status: DC | PRN
  Administered 2021-10-02: 1 via ORAL
  Filled 2021-10-02: qty 1

## 2021-10-02 MED ORDER — GLYCOPYRROLATE 0.2 MG/ML IJ SOLN
0.2000 mg | INTRAMUSCULAR | Status: DC | PRN
  Filled 2021-10-02 (×2): qty 1

## 2021-10-02 MED ORDER — SODIUM CHLORIDE 0.9% FLUSH
3.0000 mL | Freq: Two times a day (BID) | INTRAVENOUS | Status: DC
  Administered 2021-10-02 – 2021-10-03 (×3): 3 mL via INTRAVENOUS

## 2021-10-02 MED ORDER — HALOPERIDOL LACTATE 5 MG/ML IJ SOLN: 0.5000 mg | INTRAMUSCULAR | Status: DC | PRN

## 2021-10-02 MED ORDER — BIOTENE DRY MOUTH MT LIQD: 15.0000 mL | OROMUCOSAL | Status: DC | PRN

## 2021-10-02 MED ORDER — HYDROMORPHONE HCL 1 MG/ML IJ SOLN
0.2500 mg | Freq: Once | INTRAMUSCULAR | Status: AC
  Administered 2021-10-02: 0.25 mg via INTRAVENOUS
  Filled 2021-10-02: qty 0.5

## 2021-10-02 MED ORDER — ONDANSETRON 4 MG PO TBDP: 4.0000 mg | ORAL_TABLET | Freq: Four times a day (QID) | ORAL | Status: DC | PRN

## 2021-10-02 MED ORDER — LORAZEPAM 2 MG/ML PO CONC: 1.0000 mg | ORAL | Status: DC | PRN

## 2021-10-02 MED ORDER — POLYVINYL ALCOHOL 1.4 % OP SOLN
1.0000 [drp] | Freq: Four times a day (QID) | OPHTHALMIC | Status: DC | PRN
  Filled 2021-10-02: qty 15

## 2021-10-02 MED ORDER — ACETAMINOPHEN 650 MG RE SUPP: 650.0000 mg | Freq: Four times a day (QID) | RECTAL | Status: DC | PRN

## 2021-10-02 MED ORDER — GLYCOPYRROLATE 1 MG PO TABS
1.0000 mg | ORAL_TABLET | ORAL | Status: DC | PRN
  Filled 2021-10-02: qty 1

## 2021-10-02 MED ORDER — SODIUM CHLORIDE 0.9% FLUSH: 3.0000 mL | INTRAVENOUS | Status: DC | PRN

## 2021-10-02 MED ORDER — HYDROMORPHONE HCL 1 MG/ML IJ SOLN
0.2500 mg | INTRAMUSCULAR | Status: DC | PRN
  Administered 2021-10-03 (×3): 0.5 mg via INTRAVENOUS
  Filled 2021-10-02 (×3): qty 0.5

## 2021-10-02 MED ORDER — HALOPERIDOL LACTATE 2 MG/ML PO CONC
0.5000 mg | ORAL | Status: DC | PRN
  Filled 2021-10-02: qty 0.3

## 2021-10-02 MED ORDER — LORAZEPAM 1 MG PO TABS: 1.0000 mg | ORAL_TABLET | ORAL | Status: DC | PRN

## 2021-10-02 MED ORDER — LORAZEPAM 2 MG/ML IJ SOLN
1.0000 mg | INTRAMUSCULAR | Status: DC | PRN
  Administered 2021-10-02 – 2021-10-03 (×2): 1 mg via INTRAVENOUS
  Filled 2021-10-02 (×2): qty 1

## 2021-10-02 MED ORDER — GLYCOPYRROLATE 0.2 MG/ML IJ SOLN
0.2000 mg | INTRAMUSCULAR | Status: DC | PRN
  Filled 2021-10-02: qty 1

## 2021-10-02 MED ORDER — ONDANSETRON HCL 4 MG/2ML IJ SOLN: 4.0000 mg | Freq: Four times a day (QID) | INTRAMUSCULAR | Status: DC | PRN

## 2021-10-02 MED ORDER — ACETAMINOPHEN 325 MG PO TABS: 650.0000 mg | ORAL_TABLET | Freq: Four times a day (QID) | ORAL | Status: DC | PRN

## 2021-10-02 NOTE — TOC Progression Note (Signed)
Transition of Care [facility]) - Progression Note    Patient Details  Name: Stacy Watts MRN: [id number] Date of Birth: 25-Oct-1930  Transition [facility] Care [facility] CM/SW Contact  Chapman Fitch, RN Phone Number: 10/02/2021, 3:07 PM  Clinical Narrative:    Patient has been approved for hospice home.  No bed availability today    Expected Discharge Plan: Skilled Nursing Facility Barriers to Discharge: Continued Medical Work up  Expected Discharge Plan and Services Expected Discharge Plan: Skilled Nursing Facility   Discharge Planning Services: CM Consult   Living arrangements for the past 2 months: Single Family Home                                       Social Determinants of Health (SDOH) Interventions    Readmission Risk Interventions No flowsheet data found.

## 2021-10-02 NOTE — Progress Notes (Addendum)
Civil engineer, contracting [facility]) Hospital Liaison Note  Received request from Transitions of Care Manager Judeth Cornfield for family interest in Hospice Home. Visited patient at bedside and spoke with [name] Jacki Cones to confirm interest and explain services.  Approval for Hospice Home is determined by [facility] MD. Once [facility] MD has determined Hospice Home eligibility, [facility] will update hospital staff and family. Hospice eligibility is approved.  Addendum: 3:10p  Unfortunately, Hospice Home is not able to offer a room today. Family and Stephanie/ [place] Manager aware. Hospital liaison will follow up tomorrow or sooner if a room becomes available.   Please do not hesitate to call with any hospice related questions.    Thank you for the opportunity to participate in this patient's care.  Odette Fraction, MSW [facility] Liaison  [phone]

## 2021-10-02 NOTE — Progress Notes (Signed)
SLP Cancellation Note  Patient Details Name: Stacy Watts MRN: [id number] DOB: 07/07/1931   Cancelled treatment:       Reason Eval/Treat Not Completed:  (chart reviewed; consulted Palliative Care and NSG) Pt has transitioned to [place] and is awaiting transfer to the [place]. ST services can be available if any new questions while admitted.     Orinda Kenner, MS, CCC-SLP Speech Language Pathologist Rehab Services; [place] [phone] (ascom) Rheba Diamond 10/02/2021, 4:11 PM

## 2021-10-02 NOTE — Progress Notes (Signed)
°  Progress Note   Patient: Stacy Watts [id number] DOB: 22-Jul-1931 DOA: 09/23/2021     9 DOS: the patient was seen and examined on 10/02/2021   Brief hospital course: TAWANIA DIMICHELE is a 88 y.o. female with medical history significant for CKD stage IIIa, hypertension, dementia, OSA. She was brought to the hospital because of back pain and increasing confusion.  She was recently treated for UTI with a 3-day course of Bactrim (started Bactrim on 09/19/2021).    She was found to have severe sepsis thought to be due to UTI initially. However, was then discovered that she had abscess of right psoas muscle, in epidural lumbar space, and vertebral osteomyelitis.   Assessment and Plan:  GOC- with continued poor prognosis despite receiving full care for sepsis, family has decided to transition to comfort care at this time. Invasive and life-prolonging interventions that can be painful or anxiety provoking will be discontinued at this time.  - further recommendations by palliative.   Sepsis- criteria have resolved  Hypokalemia- Resolved on last check  Dysphagia On pureed diet with thickened liquids  L1-L2 acute osteomyelitis/discitis with right psoas abscess- s/p aspiration and cultures negative growth to date. Was on CTX IV which is discontinued today. - analgesia PRN - antipyretics PRN  Intertrigo- (present on admission) Patient has erythema under bilateral breasts, appears consistent with candidal intertrigo. -- Nystatin powder -- Monitor closely  NSVT (nonsustained ventricular tachycardia)  Pressure injury of skin - repositioning as outlined in comfort orders  Urinary retention- (present on admission) 2/2 pelvic prolapse - maintain foley cathetor  Hypernatremia   hyponatremia- resolved at last check  Acute metabolic encephalopathy- (present on admission) Persistent. Patient lethargic.  Supportive care / delirium precautions.  AKI (acute kidney injury) ([place]) [id number]-  (present on admission) - no longer monitoring  HTN (hypertension)- (present on admission) - daily vitals  Bilateral hydronephrosis 2/2 obstruction- (present on admission) - foley in place  OSA (obstructive sleep apnea)- (present on admission) CPAP ordered  Subjective: patient is not responsive to conversation. Daughters at bedside state that she had a hard night full of back pain but was then able to rest early this morning with administration of pain medications.  Physical Exam: Vitals:   10/01/21 0824 10/01/21 1612 10/01/21 2346 10/02/21 0741  BP: (!) 119/54 102/62 128/75 112/62  Pulse: (!) 55 79 (!) 102 74  Resp:  18 20   Temp: 97.8 F (36.6 C) 97.7 F (36.5 C) 97.7 F (36.5 C) 97.6 F (36.4 C)  TempSrc:  Axillary Axillary   SpO2: 94% 93% 93% 95%  Weight:      Height:       General exam: somnolent, no acute distress HEENT: dry mucus membranes Respiratory system: normal respiratory effort, on 2L [place] Cardiovascular system: warm limbs  Skin: dry, intact, normal temperature Psychiatry: unable to evaluate due to somnolence   Data Reviewed:  WBC 21>19.4 Hgb 12.4>11.8 Abscess cultures NGTD  Family Communication: daughters at bedside  Disposition: Status is: Inpatient Remains inpatient appropriate because: severity of illness. To be discharged with hospice when bed available    Planned Discharge Destination: hospice   Time spent: >35 minutes  Author: Richarda Osmond, MD 10/02/2021 7:43 AM  For on call review [email or web address].

## 2021-10-02 NOTE — TOC Progression Note (Signed)
Transition of Care [facility]) - Progression Note    Patient Details  Name: Stacy Watts MRN: [id number] Date of Birth: Jun 03, 1931  [facility]) CM/SW Contact  Beverly Sessions, RN Phone Number: 10/02/2021, 12:02 PM  Clinical Narrative:    Met with 2 daughters ar bedside They confirm their wishes are for residential hospice in [place] Referral made with [facility].  Signed SNF on chart    Expected Discharge Plan: [place] Barriers to Discharge: Continued Medical Work up  Expected Discharge Plan and Services Expected Discharge Plan: [place]   Discharge Planning Services: CM Consult   Living arrangements for the past 2 months: Single Family Home                                       Social Determinants of Health (SDOH) Interventions    Readmission Risk Interventions No flowsheet data found.

## 2021-10-02 NOTE — Progress Notes (Incomplete)
Date of Admission:  09/23/2021   Total days of antibiotics ***        Day ***        Day ***        Day ***   ID: Stacy Watts is a 88 y.o. female with  *** Principal Problem:   Sepsis secondary to UTI ([place]) Active Problems:   CKD (chronic kidney disease) stage 3, GFR 30-59 ml/min ([facility])   OSA (obstructive sleep apnea)   UTI (urinary tract infection)   Bilateral hydronephrosis   HTN (hypertension)   AKI (acute kidney injury) ([facility])   Hyponatremia   Acute metabolic encephalopathy   Back pain   Fever   Hypernatremia   Urinary retention   Pressure injury of skin   NSVT (nonsustained ventricular tachycardia)   Intertrigo   Vertebral osteomyelitis ([facility])   Abscess in epidural space of lumbar spine   Psoas abscess, right (H[facility])   Dysphagia   Hypokalemia    Subjective: ***  Medications:   Chlorhexidine Gluconate Cloth  6 each Topical Daily   heparin  5,000 Units Subcutaneous Q8H   lidocaine  1 patch Transdermal Q24H   mouth rinse  15 mL Mouth Rinse BID   nystatin   Topical BID   polyethylene glycol  17 g Oral Daily   torsemide  20 mg Oral BID    Objective: Vital signs in last 24 hours: Temp:  [97.6 F (36.4 C)-97.7 F (36.5 C)] 97.6 F (36.4 C) (02/15 0741) Pulse Rate:  [74-102] 74 (02/15 0741) Resp:  [18-20] 20 (02/14 2346) BP: (102-128)/(62-75) 112/62 (02/15 0741) SpO2:  [93 %-95 %] 95 % (02/15 0741)  PHYSICAL EXAM:  General: Alert, cooperative, no distress, appears stated age.  Head: Normocephalic, without obvious abnormality, atraumatic. Eyes: Conjunctivae clear, anicteric sclerae. Pupils are equal ENT Nares normal. No drainage or sinus tenderness. Lips, mucosa, and tongue normal. No Thrush Neck: Supple, symmetrical, no adenopathy, thyroid: non tender no carotid bruit and no JVD. Back: No CVA tenderness. Lungs: Clear to auscultation bilaterally. No Wheezing or Rhonchi. No rales. Heart: Regular rate and rhythm, no murmur, rub or gallop. Abdomen:  Soft, non-tender,not distended. Bowel sounds normal. No masses Extremities: atraumatic, no cyanosis. No edema. No clubbing Skin: No rashes or lesions. Or bruising Lymph: Cervical, supraclavicular normal. Neurologic: Grossly non-focal  Lab Results Recent Labs    10/01/21 0534 10/02/21 0447  WBC 21.0* 19.4*  HGB 12.4 11.8*  HCT 39.1 37.0  NA 144 139  K 3.5 3.9  CL 102 99  CO2 34* 34*  BUN 45* 40*  CREATININE 1.16* 1.36*   Liver Panel Recent Labs    09/30/21 0457  PROT 5.9*  ALBUMIN 2.4*  AST 17  ALT 13  ALKPHOS 79  BILITOT 0.7   Sedimentation Rate Recent Labs    10/01/21 0534  ESRSEDRATE 36*   C-Reactive Protein Recent Labs    10/01/21 0534  CRP 1.8*    Microbiology:  Studies/Results: CT GUIDED NEEDLE PLACEMENT  Result Date: 09/30/2021 INDICATION: Concern for discitis/osteomyelitis of L1-L2, with associated right paraspinal abscess. Please perform CT-guided paraspinal abscess aspiration for tissue diagnostic purposes. EXAM: CT-GUIDED RIGHT-SIDED PARASPINAL ABSCESS ASPIRATION COMPARISON:  Lumbar spine MRI-09/27/2021; CT abdomen pelvis-09/23/2021 MEDICATIONS: The patient is currently admitted to the hospital and receiving intravenous antibiotics. The antibiotics were administered within an appropriate time frame prior to the initiation of the procedure. ANESTHESIA/SEDATION: Moderate (conscious) sedation was employed during this procedure as administered by the Interventional Radiology RN. A total  of Versed 0.5 mg and Fentanyl 25 mcg was administered intravenously. Moderate Sedation Time: 10 minutes. The patient's level of consciousness and vital signs were monitored continuously by radiology nursing throughout the procedure under my direct supervision. CONTRAST:  None COMPLICATIONS: None immediate. PROCEDURE: RADIATION DOSE REDUCTION: This exam was performed according to the departmental dose-optimization program which includes automated exposure control, adjustment of  the mA and/or kV according to patient size and/or use of iterative reconstruction technique. Informed written consent was obtained from the patient after a discussion of the risks, benefits and alternatives to treatment. The patient was placed prone on the CT gantry and a pre procedural CT was performed re-demonstrating the known abscess/fluid collection within the right paraspinal musculature with dominant ill-defined component measuring approximally 1.7 x 1.4 cm (image 28, series 9). The procedure was planned. A timeout was performed prior to the initiation of the procedure. The skin overlying the right mid/lower back was prepped and draped in the usual sterile fashion. The overlying soft tissues were anesthetized with 1% lidocaine with epinephrine. Appropriate trajectory was planned with the use of a 22 gauge spinal needle (series 16). Next, an 18 gauge trocar needle was advanced into the abscess/fluid collection. Appropriate position was confirmed with CT imaging (image 10, series 18), and approximately 2 cc of purulent appearing fluid was aspirated as the needle was slowly withdrawn. All aspirated fluid was capped sent to the laboratory for analysis. A dressing was applied. The patient tolerated the procedure well without immediate postprocedural complication. IMPRESSION: Successful CT guided aspiration of approximately 2 cc of purulent appearing fluid from ill-defined right-sided paraspinal abscess. Samples were sent to the laboratory as requested by the ordering clinical team. Electronically Signed   By: Sandi Mariscal M.D.   On: 09/30/2021 16:07     Assessment/Plan: ***

## 2021-10-02 NOTE — Progress Notes (Signed)
Daily Progress Note   Patient Name: Stacy Watts       Date: 10/02/2021 DOB: 1931-04-03  Age: 88 y.o. MRN#: [id number] Attending Physician: Richarda Osmond, MD Primary Care Physician: Kirk Ruths, MD Admit Date: 09/23/2021  Reason for Consultation/Follow-up: Establishing goals of care  Subjective: Patient is in bed. Her 2 daughters are at bedside. Patient awakens to speak with me. She does not answer questions for me. When daughter asks her to state her name, with great delay she states her first name. She also states she is at [facility] after long delay. She does not answer further questions. She dozes, and when she awakens she moans and winces in pain.  We discussed her current status, diagnoses, prognosis, GOC, EOL wishes disposition and options.  A detailed discussion was had today regarding advanced directives.  Concepts specific to code status, artifical feeding and hydration, IV antibiotics and rehospitalization were discussed.  The difference between an aggressive medical intervention path and a comfort care path was discussed.  Values and goals of care important to patient and family were attempted to be elicited.  Discussed limitations of medical interventions to prolong quality of life in some situations and discussed the concept of human mortality.  Daughters state she did not want to come to the hospital, and they brought her because of pain. We discuss her pain and we discuss her confusion.  They state she would not want to do rehab, and would want to focus on comfort and dignity for what time she has left on this earth. They feel her QOL on the other side of this hospitalization will not be acceptable. They request hospice facility placement.   No number listed for  patient's son. Discussed speaking with him, and they state they will talk to him personally themselves.   I completed a MOST form today with 2 daughters and the signed original was placed in the chart. A photocopy was placed in the chart to be scanned into EMR. The patient outlined their wishes for the following treatment decisions:  Cardiopulmonary Resuscitation: Do Not Attempt Resuscitation (DNR/No CPR)  Medical Interventions: Comfort Measures: Keep clean, warm, and dry. Use medication by any route, positioning, wound care, and other measures to relieve pain and suffering. Use oxygen, suction and manual treatment of airway obstruction as needed  for comfort. Do not transfer to the hospital unless comfort needs cannot be met in current location.  Antibiotics: No antibiotics (use other measures to relieve symptoms)  IV Fluids: No IV fluids (provide other measures to ensure comfort)  Feeding Tube: No feeding tube       Length of Stay: 9  Current Medications: Scheduled Meds:    HYDROmorphone (DILAUDID) injection  0.25 mg Intravenous Once   mouth rinse  15 mL Mouth Rinse BID   nystatin   Topical BID   polyethylene glycol  17 g Oral Daily   torsemide  20 mg Oral BID    Continuous Infusions:  methocarbamol (ROBAXIN) IV Stopped (09/30/21 0428)    PRN Meds: acetaminophen, HYDROcodone-acetaminophen, HYDROmorphone (DILAUDID) injection, methocarbamol (ROBAXIN) IV  Physical Exam Pulmonary:     Comments: Congested lung sounds.  Neurological:     Mental Status: She is alert.            Vital Signs: BP 112/62 (BP Location: Right Wrist)    Pulse 74    Temp 97.6 F (36.4 C)    Resp 20    Ht _0  (1.499 m)    Wt 69.8 kg    SpO2 95%    BMI 31.08 kg/m  SpO2: SpO2: 95 % O2 Device: O2 Device: Nasal Cannula O2 Flow Rate: O2 Flow Rate (L/min): 2 L/min  Intake/output summary:  Intake/Output Summary (Last 24 hours) at 10/02/2021 1123 Last data filed at 10/02/2021 0741 Gross per 24 hour  Intake 0  ml  Output 550 ml  Net -550 ml   LBM: Last BM Date : 09/30/21 Baseline Weight: Weight: 72.6 kg Most recent weight: Weight: 69.8 kg        Patient Active Problem List   Diagnosis Date Noted   Dysphagia 09/29/2021   Hypokalemia 09/29/2021   Vertebral osteomyelitis ([place]) 09/28/2021   Abscess in epidural space of lumbar spine 09/28/2021   Psoas abscess, right ([place]) 09/28/2021   Fever 09/27/2021   Hypernatremia 09/27/2021   Urinary retention 09/27/2021   Pressure injury of skin 09/27/2021   NSVT (nonsustained ventricular tachycardia) 09/27/2021   Intertrigo 11/57/2620   Acute metabolic encephalopathy 35/59/7416   Back pain 09/25/2021   UTI (urinary tract infection) 09/23/2021   Bilateral hydronephrosis 09/23/2021   HTN (hypertension) 09/23/2021   AKI (acute kidney injury) ([place]) 09/23/2021   Hyponatremia 09/23/2021   Sepsis secondary to UTI ([place]) 09/23/2021   Intractable headache 01/24/2017   CKD (chronic kidney disease) stage 3, GFR 30-59 ml/min (HCC) 04/03/2014   OSA (obstructive sleep apnea) 04/03/2014    Palliative Care Assessment & Plan    Recommendations/Plan: Shift to full comfort care. Recommend hospice facility.   Code Status:    Code Status Orders  (From admission, onward)           Start     Ordered   10/02/21 1114  Do not attempt resuscitation (DNR)  Continuous       Question Answer Comment  In the event of cardiac or respiratory ARREST Do not call a code blue   In the event of cardiac or respiratory ARREST Do not perform Intubation, CPR, defibrillation or ACLS   In the event of cardiac or respiratory ARREST Use medication by any route, position, wound care, and other measures to relive pain and suffering. May use oxygen, suction and manual treatment of airway obstruction as needed for comfort.   Comments MOST form on chart.      10/02/21 1113  Code Status History     Date Active Date Inactive Code Status Order ID Comments User  Context   09/23/2021 2014 10/02/2021 1113 Full Code [id number]  Para Skeans, MD ED   01/25/2017 0059 01/25/2017 1916 Full Code [id number]  Hugelmeyer, Ubaldo Glassing, DO Inpatient       Prognosis:  < 2 weeks    Care plan was discussed with Primary MD and TOC  Thank you for allowing the Palliative Medicine Team to assist in the care of this patient.       Total Time 65 min Prolonged Time Billed  no       Greater than 50%  of this time was spent counseling and coordinating care related to the above assessment and plan.  Asencion Gowda, NP  Please contact Palliative Medicine Team phone at [phone] for questions and concerns.

## 2021-10-02 NOTE — Discharge Summary (Signed)
Physician Discharge Summary  Stacy HughesCatherine Watts Watts LKG:[id number] DOB: Jun 15, 1931 DOA: 09/23/2021  PCP: Stacy RegulusAnderson, Marshall W, MD  Admit date: 09/23/2021 Discharge date: 10/03/2021  Admitted From: Home Disposition: Hospice care  Recommendations for Outpatient Follow-up:  Follow up with hospice on same day of discharge Recommend weaning and discontinuing oxygen therapy after transportation to be in line with goal of comfort care  Discharge Condition:stable CODE STATUS:  Code Status: DNR  Comfort diet  Brief/Interim Summary: Stacy BeaverCatherine Watts Watts is a 88 y.o. female with medical history significant for CKD stage IIIa, hypertension, dementia, OSA. She was brought to the hospital because of back pain and increasing confusion.  She was recently treated for UTI with a 3-day course of Bactrim (started Bactrim on 09/19/2021).    She was found to have severe sepsis thought to be due to UTI initially. However, was then discovered that she had abscess of right psoas muscle, in epidural lumbar space, and vertebral osteomyelitis. Neurosurgery, ID, and IR were consulted.  Patient received aspiration of abscess to help guide antibiotic therapy but cultures did not show any growth. She did not show significant improvement with treatment and would be looking at prolonged hospital stay for IV Abx treatment.  GOC- with continued poor prognosis despite receiving full care for sepsis, family decided to transition to comfort care. Invasive and life-prolonging interventions that can be painful or anxiety provoking were discontinued. Patient was stable and appeared comfortable at time of discharge to hospice house.  Discharge Diagnoses:  Principal Problem:   Sepsis secondary to UTI [facility](HCC) Active Problems:   CKD (chronic kidney disease) stage 3, GFR 30-59 ml/min (HCC)   OSA (obstructive sleep apnea)   UTI (urinary tract infection)   Bilateral hydronephrosis   HTN (hypertension)   AKI (acute kidney injury) (HCC)    Hyponatremia   Acute metabolic encephalopathy   Back pain   Fever   Hypernatremia   Urinary retention   Pressure injury of skin   NSVT (nonsustained ventricular tachycardia)   Intertrigo   Vertebral osteomyelitis (HCC)   Abscess in epidural space of lumbar spine   Psoas abscess, right (HCC)   Dysphagia   Hypokalemia   Sepsis (HCC)   Discharge Instructions     Continue foley catheter   Complete by: As directed    Continue at dc      Allergies as of 10/03/2021       Reactions   Amlodipine    Other reaction(s): Dizziness   Cefdinir    Other reaction(s): Unknown   Ciprofloxacin    Other reaction(s): Unknown   Losartan    Other reaction(s): Angioedema   Nitrofurantoin Swelling   Other    UNKNOWN BLOOD PRESSURE MEDICATION   Carvedilol Rash   Clonidine Rash, Swelling        Medication List     STOP taking these medications    aspirin EC 81 MG tablet   cholecalciferol 1000 units tablet Commonly known as: VITAMIN D   potassium chloride 10 MEQ tablet Commonly known as: KLOR-CON   torsemide 20 MG tablet Commonly known as: DEMADEX       TAKE these medications    acetaminophen 500 MG tablet Commonly known as: TYLENOL Take 500 mg by mouth every 6 (six) hours as needed.   haloperidol 0.5 MG tablet Commonly known as: HALDOL Take 1 tablet (0.5 mg total) by mouth every 4 (four) hours as needed for agitation (or delirium).   haloperidol 2 MG/ML solution Commonly known as: HALDOL Place  0.3 mLs (0.6 mg total) under the tongue every 4 (four) hours as needed for agitation (or delirium).   haloperidol lactate 5 MG/ML injection Commonly known as: HALDOL Inject 0.1 mLs (0.5 mg total) into the vein every 4 (four) hours as needed (or delirium).   HYDROcodone-acetaminophen 5-325 MG tablet Commonly known as: NORCO/VICODIN Take 1 tablet by mouth every 4 (four) hours as needed (mild pain).   HYDROmorphone 1 MG/ML injection Commonly known as: DILAUDID Inject  0.25-0.5 mLs (0.25-0.5 mg total) into the vein every hour as needed for moderate pain or severe pain (0.25mg  for moderate pain, 0.5mg  for severe pain).   LORazepam 1 MG tablet Commonly known as: ATIVAN Take 1 tablet (1 mg total) by mouth every 4 (four) hours as needed for anxiety.   LORazepam 2 MG/ML concentrated solution Commonly known as: ATIVAN Place 0.5 mLs (1 mg total) under the tongue every 4 (four) hours as needed for anxiety.   ondansetron 4 MG disintegrating tablet Commonly known as: ZOFRAN-ODT Take 1 tablet (4 mg total) by mouth every 6 (six) hours as needed for nausea.   polyvinyl alcohol 1.4 % ophthalmic solution Commonly known as: LIQUIFILM TEARS Place 1 drop into both eyes 4 (four) times daily as needed for dry eyes.        Contact information for after-discharge care     Destination     [facility] Preferred [facility] .   Service: Skilled Nursing Contact information: [place] [address] [phone]                    Allergies  Allergen Reactions   Amlodipine     Other reaction(s): Dizziness   Cefdinir     Other reaction(s): Unknown   Ciprofloxacin     Other reaction(s): Unknown   Losartan     Other reaction(s): Angioedema   Nitrofurantoin Swelling   Other     UNKNOWN BLOOD PRESSURE MEDICATION   Carvedilol Rash   Clonidine Rash and Swelling    Consultations: IR ID Palliative care Neurosurgery   Procedures/Studies: CT ABDOMEN PELVIS WO CONTRAST  Result Date: 09/23/2021 CLINICAL DATA:  Back pain, urinary tract infection, lower extremity edema EXAM: CT ABDOMEN AND PELVIS WITHOUT CONTRAST TECHNIQUE: Multidetector CT imaging of the abdomen and pelvis was performed following the standard protocol without IV contrast. Unenhanced CT was performed per clinician order. Lack of IV contrast limits sensitivity and specificity, especially for evaluation of abdominal/pelvic solid viscera. RADIATION DOSE  REDUCTION: This exam was performed according to the departmental dose-optimization program which includes automated exposure control, adjustment of the mA and/or kV according to patient size and/or use of iterative reconstruction technique. COMPARISON:  None. FINDINGS: Lower chest: No acute pleural or parenchymal lung disease. Mild cardiomegaly with trace pericardial fluid. Hepatobiliary: The gallbladder is surgically absent. Subcentimeter cyst left lobe liver. Otherwise unremarkable unenhanced appearance of the liver. Pancreas: Unremarkable unenhanced appearance. Spleen: Unremarkable unenhanced appearance. Adrenals/Urinary Tract: No evidence of urinary tract calculi within either kidney. There is mild bilateral hydronephrosis and hydroureter, left greater than right, likely due to marked bladder distension. No filling defects or bladder wall irregularity identified. Stomach/Bowel: No bowel obstruction or ileus. Normal appendix right mid abdomen. No bowel wall thickening or inflammatory change. Vascular/Lymphatic: Aortic atherosclerosis. No enlarged abdominal or pelvic lymph nodes. Reproductive: Status post hysterectomy. No adnexal masses. Other: There is evidence of pelvic floor laxity with vaginal and rectal prolapse. No free intraperitoneal fluid or free gas. No abdominal wall  hernia. Musculoskeletal: No acute or destructive bony lesions. Reconstructed images demonstrate no additional findings. IMPRESSION: 1. Marked distension of the urinary bladder, with resulting mild bilateral hydronephrosis and hydroureter left greater than right. No urinary tract calculi. 2. Pelvic floor laxity, with vaginal and rectal prolapse. 3.  Aortic Atherosclerosis (ICD10-I70.0). 4. Cardiomegaly with trace pericardial fluid. Electronically Signed   By: Randa Ngo M.D.   On: 09/23/2021 18:09   CT GUIDED NEEDLE PLACEMENT  Result Date: 09/30/2021 INDICATION: Concern for discitis/osteomyelitis of L1-L2, with associated right  paraspinal abscess. Please perform CT-guided paraspinal abscess aspiration for tissue diagnostic purposes. EXAM: CT-GUIDED RIGHT-SIDED PARASPINAL ABSCESS ASPIRATION COMPARISON:  Lumbar spine MRI-09/27/2021; CT abdomen pelvis-09/23/2021 MEDICATIONS: The patient is currently admitted to the hospital and receiving intravenous antibiotics. The antibiotics were administered within an appropriate time frame prior to the initiation of the procedure. ANESTHESIA/SEDATION: Moderate (conscious) sedation was employed during this procedure as administered by the Interventional Radiology RN. A total of Versed 0.5 mg and Fentanyl 25 mcg was administered intravenously. Moderate Sedation Time: 10 minutes. The patient's level of consciousness and vital signs were monitored continuously by radiology nursing throughout the procedure under my direct supervision. CONTRAST:  None COMPLICATIONS: None immediate. PROCEDURE: RADIATION DOSE REDUCTION: This exam was performed according to the departmental dose-optimization program which includes automated exposure control, adjustment of the mA and/or kV according to patient size and/or use of iterative reconstruction technique. Informed written consent was obtained from the patient after a discussion of the risks, benefits and alternatives to treatment. The patient was placed prone on the CT gantry and a pre procedural CT was performed re-demonstrating the known abscess/fluid collection within the right paraspinal musculature with dominant ill-defined component measuring approximally 1.7 x 1.4 cm (image 28, series 9). The procedure was planned. A timeout was performed prior to the initiation of the procedure. The skin overlying the right mid/lower back was prepped and draped in the usual sterile fashion. The overlying soft tissues were anesthetized with 1% lidocaine with epinephrine. Appropriate trajectory was planned with the use of a 22 gauge spinal needle (series 16). Next, an 18 gauge  trocar needle was advanced into the abscess/fluid collection. Appropriate position was confirmed with CT imaging (image 10, series 18), and approximately 2 cc of purulent appearing fluid was aspirated as the needle was slowly withdrawn. All aspirated fluid was capped sent to the laboratory for analysis. A dressing was applied. The patient tolerated the procedure well without immediate postprocedural complication. IMPRESSION: Successful CT guided aspiration of approximately 2 cc of purulent appearing fluid from ill-defined right-sided paraspinal abscess. Samples were sent to the laboratory as requested by the ordering clinical team. Electronically Signed   By: Sandi Mariscal M.D.   On: 09/30/2021 16:07   MR THORACIC SPINE Watts WO CONTRAST  Result Date: 09/27/2021 CLINICAL DATA:  Mid back pain.  Increased confusion.  UTI. EXAM: MRI THORACIC AND LUMBAR SPINE WITHOUT AND WITH CONTRAST TECHNIQUE: Multiplanar and multiecho pulse sequences of the thoracic and lumbar spine were obtained without and with intravenous contrast. CONTRAST:  26mL GADAVIST GADOBUTROL 1 MMOL/ML IV SOLN COMPARISON:  None similar FINDINGS: MRI THORACIC SPINE FINDINGS Alignment: Degenerative exaggerated thoracic kyphosis. Mild degenerative anterolisthesis at T2-3 to T5-6. Vertebrae: No evidence of fracture, discitis, or aggressive bone lesion. Cord:  Normal signal and morphology. Paraspinal and other soft tissues: Small right pleural effusion. Disc levels: Generalized disc space narrowing and desiccation. Multiple small noncompressive disc protrusions including at T5-6, right paracentral at T6-7, centrally at T7-8, and centrally  at T8-9. Especially notable disc space narrowing with spurring of endplates and facets causing advanced left foraminal impingement at T11-12. MRI LUMBAR SPINE FINDINGS Segmentation:  5 lumbar type vertebra Alignment: Hyperlordosis with L4-5 and L5-S1 anterolisthesis. Mild scoliosis. Vertebrae: T2 hyperintensity in the disc with  indistinct endplates on T1 weighted imaging and marrow/paravertebral edema, consistent with discitis and osteomyelitis. Ventral epidural abscess is present posterior to the L1 body, best seen on sagittal postcontrast imaging where thickness is 9 mm and there is cauda equina compression. Additional right psoas abscess at the level of L2, measuring up to 14 mm on axial images. Conus medullaris: Extends to the approximately L1 level. Sacral meningeal cysts with bony scalloping. Paraspinal and other soft tissues: Right psoas abscess as noted above. Disc levels: T12- L1: Disc narrowing and bulging with left foraminal protrusion and impingement L1-L2: Disc narrowing and bulging. Ligamentum flavum thickening and facet spurring. Degenerative and infectious related thecal sac compression. Mild-to-moderate bilateral foraminal narrowing L2-L3: Asymmetric rightward disc collapse, endplate spurring, and facet spurring. Right foraminal and subarticular recess impingement L3-L4: Disc narrowing and bulging with asymmetric rightward endplate and facet spurring. Right foraminal impingement. Right subarticular recess stenosis. Spinal stenosis is moderate L4-L5: Facet degeneration with spurring and anterolisthesis. Disc space narrowing and bulging with left foraminal impingement. Left L5 impingement at the subarticular recess as well. Right foraminal narrowing is moderate L5-S1:Facet degeneration with bulky spurring and anterolisthesis. Suspect an anterior synovial cyst at the left facet. Advanced left foraminal impingement when combined with disc bulging. Critical Value/emergent results were called by telephone at the time of interpretation on 09/27/2021 at 7:05 pm to provider PA Randol Kern , who verbally acknowledged these results. IMPRESSION: 1. Discitis/osteomyelitis at L1-2 with ventral epidural abscess at L1 compressing the thecal sac. Associated right psoas abscess at the level of L2, measuring up to 14 mm. 2. Advanced lumbar spine  degeneration with scoliosis and neural impingement as described. Electronically Signed   By: Jorje Guild M.D.   On: 09/27/2021 19:08   MR Lumbar Spine Watts Wo Contrast  Result Date: 09/27/2021 CLINICAL DATA:  Mid back pain.  Increased confusion.  UTI. EXAM: MRI THORACIC AND LUMBAR SPINE WITHOUT AND WITH CONTRAST TECHNIQUE: Multiplanar and multiecho pulse sequences of the thoracic and lumbar spine were obtained without and with intravenous contrast. CONTRAST:  21mL GADAVIST GADOBUTROL 1 MMOL/ML IV SOLN COMPARISON:  None similar FINDINGS: MRI THORACIC SPINE FINDINGS Alignment: Degenerative exaggerated thoracic kyphosis. Mild degenerative anterolisthesis at T2-3 to T5-6. Vertebrae: No evidence of fracture, discitis, or aggressive bone lesion. Cord:  Normal signal and morphology. Paraspinal and other soft tissues: Small right pleural effusion. Disc levels: Generalized disc space narrowing and desiccation. Multiple small noncompressive disc protrusions including at T5-6, right paracentral at T6-7, centrally at T7-8, and centrally at T8-9. Especially notable disc space narrowing with spurring of endplates and facets causing advanced left foraminal impingement at T11-12. MRI LUMBAR SPINE FINDINGS Segmentation:  5 lumbar type vertebra Alignment: Hyperlordosis with L4-5 and L5-S1 anterolisthesis. Mild scoliosis. Vertebrae: T2 hyperintensity in the disc with indistinct endplates on T1 weighted imaging and marrow/paravertebral edema, consistent with discitis and osteomyelitis. Ventral epidural abscess is present posterior to the L1 body, best seen on sagittal postcontrast imaging where thickness is 9 mm and there is cauda equina compression. Additional right psoas abscess at the level of L2, measuring up to 14 mm on axial images. Conus medullaris: Extends to the approximately L1 level. Sacral meningeal cysts with bony scalloping. Paraspinal and other soft tissues: Right psoas  abscess as noted above. Disc levels: T12- L1:  Disc narrowing and bulging with left foraminal protrusion and impingement L1-L2: Disc narrowing and bulging. Ligamentum flavum thickening and facet spurring. Degenerative and infectious related thecal sac compression. Mild-to-moderate bilateral foraminal narrowing L2-L3: Asymmetric rightward disc collapse, endplate spurring, and facet spurring. Right foraminal and subarticular recess impingement L3-L4: Disc narrowing and bulging with asymmetric rightward endplate and facet spurring. Right foraminal impingement. Right subarticular recess stenosis. Spinal stenosis is moderate L4-L5: Facet degeneration with spurring and anterolisthesis. Disc space narrowing and bulging with left foraminal impingement. Left L5 impingement at the subarticular recess as well. Right foraminal narrowing is moderate L5-S1:Facet degeneration with bulky spurring and anterolisthesis. Suspect an anterior synovial cyst at the left facet. Advanced left foraminal impingement when combined with disc bulging. Critical Value/emergent results were called by telephone at the time of interpretation on 09/27/2021 at 7:05 pm to provider PA Randol Kern , who verbally acknowledged these results. IMPRESSION: 1. Discitis/osteomyelitis at L1-2 with ventral epidural abscess at L1 compressing the thecal sac. Associated right psoas abscess at the level of L2, measuring up to 14 mm. 2. Advanced lumbar spine degeneration with scoliosis and neural impingement as described. Electronically Signed   By: Jorje Guild M.D.   On: 09/27/2021 19:08   DG Chest Port 1 View  Result Date: 09/27/2021 CLINICAL DATA:  Fever. EXAM: PORTABLE CHEST 1 VIEW COMPARISON:  Chest x-ray dated September 23, 2021. FINDINGS: Unchanged mild cardiomegaly. Normal pulmonary vascularity. Increasing opacity at the right lung base with possible new small right pleural effusion. The left lung is clear. No pneumothorax. No acute osseous abnormality. IMPRESSION: 1. Increasing right basilar atelectasis  versus infiltrate with possible new small right pleural effusion. Electronically Signed   By: Titus Dubin M.D.   On: 09/27/2021 08:55   DG Chest Portable 1 View  Result Date: 09/23/2021 CLINICAL DATA:  Shortness of breath EXAM: PORTABLE CHEST 1 VIEW COMPARISON:  Previous studies including the examination of 12/07/2010 FINDINGS: Transverse diameter of heart is increased. There is poor inspiration. There are linear densities in the lower lung fields. There are no signs of pulmonary edema or focal pulmonary consolidation. Lateral CP angles are indistinct. There is no pneumothorax. Degenerative changes are noted in the left shoulder. IMPRESSION: Cardiomegaly. Linear densities in the lower lung fields suggest scarring or subsegmental atelectasis. Blunting of lateral CP angles may suggest pleural thickening or minimal effusions. There are no signs of alveolar pulmonary edema or focal pulmonary consolidation. Electronically Signed   By: Elmer Picker M.D.   On: 09/23/2021 17:31    Subjective: Patient is resting comfortably on my exam and not responsive to conversation. Her daughters are at bedside and state that she has been sleeping for the past 8 hours since receiving ativan but had previously been agitated overnight.  Discharge Exam: Vitals:   10/02/21 1518 10/03/21 0433  BP: 119/66 124/60  Pulse: 84 92  Resp:  16  Temp: 97.6 F (36.4 C) 98.9 F (37.2 C)  SpO2: (!) 89% (!) 89%    General: Pt is asleep, not in acute distress Cardiovascular: warm extremities Respiratory: no increased WOB Extremities: no edema, no cyanosis  Labs: Basic Metabolic Panel: Recent Labs  Lab 09/28/21 0405 09/29/21 0328 09/30/21 0457 10/01/21 0534 10/02/21 0447  NA 148* 142 148* 144 139  K 4.0 3.4* 3.7 3.5 3.9  CL 109 106 103 102 99  CO2 29 29 31  34* 34*  GLUCOSE 140* 131* 122* 141* 129*  BUN 45* 49* 46*  45* 40*  CREATININE 1.24* 1.23* 1.15* 1.16* 1.36*  CALCIUM 9.3 8.9 9.1 8.9 8.7*  MG  --  2.2   --  2.1 2.0   CBC: Recent Labs  Lab 09/28/21 0405 09/29/21 0328 09/30/21 0457 10/01/21 0534 10/02/21 0447  WBC 20.7* 16.5* 21.0* 21.0* 19.4*  NEUTROABS 16.5*  --   --   --   --   HGB 12.0 11.4* 12.6 12.4 11.8*  HCT 38.0 35.1* 39.1 39.1 37.0  MCV 95.7 95.4 94.2 95.1 93.9  PLT 404* 397 419* 437* 393    Microbiology Recent Results (from the past 240 hour(s))  Resp Panel by RT-PCR (Flu A&B, Covid) Nasopharyngeal Swab     Status: None   Collection Time: 09/23/21  4:55 PM   Specimen: Nasopharyngeal Swab; Nasopharyngeal(NP) swabs in vial transport medium  Result Value Ref Range Status   SARS Coronavirus 2 by RT PCR NEGATIVE NEGATIVE Final    Comment: (NOTE) SARS-CoV-2 target nucleic acids are NOT DETECTED.  The SARS-CoV-2 RNA is generally detectable in upper respiratory specimens during the acute phase of infection. The lowest concentration of SARS-CoV-2 viral copies this assay can detect is 138 copies/mL. A negative result does not preclude SARS-Cov-2 infection and should not be used as the sole basis for treatment or other patient management decisions. A negative result may occur with  improper specimen collection/handling, submission of specimen other than nasopharyngeal swab, presence of viral mutation(s) within the areas targeted by this assay, and inadequate number of viral copies(<138 copies/mL). A negative result must be combined with clinical observations, patient history, and epidemiological information. The expected result is Negative.  Fact Sheet for Patients:  EntrepreneurPulse.com.au  Fact Sheet for Healthcare Providers:  IncredibleEmployment.be  This test is no t yet approved or cleared by the [place] FDA and  has been authorized for detection and/or diagnosis of SARS-CoV-2 by FDA under an Emergency Use Authorization (EUA). This EUA will remain  in effect (meaning this test can be used) for the duration of  the COVID-19 declaration under Section 564(b)(1) of the Act, 21 U.S.C.section 360bbb-3(b)(1), unless the authorization is terminated  or revoked sooner.       Influenza A by PCR NEGATIVE NEGATIVE Final   Influenza B by PCR NEGATIVE NEGATIVE Final    Comment: (NOTE) The Xpert Xpress SARS-CoV-2/FLU/RSV plus assay is intended as an aid in the diagnosis of influenza from Nasopharyngeal swab specimens and should not be used as a sole basis for treatment. Nasal washings and aspirates are unacceptable for Xpert Xpress SARS-CoV-2/FLU/RSV testing.  Fact Sheet for Patients: EntrepreneurPulse.com.au  Fact Sheet for Healthcare Providers: IncredibleEmployment.be  This test is not yet approved or cleared by the [place] FDA and has been authorized for detection and/or diagnosis of SARS-CoV-2 by FDA under an Emergency Use Authorization (EUA). This EUA will remain in effect (meaning this test can be used) for the duration of the COVID-19 declaration under Section 564(b)(1) of the Act, 21 U.S.C. section 360bbb-3(b)(1), unless the authorization is terminated or revoked.  Performed at [facility], [address], [place], [place] [address]   Urine Culture     Status: Abnormal   Collection Time: 09/23/21  6:35 PM   Specimen: Urine, Clean Catch  Result Value Ref Range Status   Specimen Description   Final    URINE, CLEAN CATCH Performed at [facility], [address], [place], [place] [address]    Special Requests   Final    NONE Performed at [facility], [address], [place], [place] [address]    Culture (A)  Final    <10,000 COLONIES/mL INSIGNIFICANT GROWTH Performed at [facility] [address] [place], [place] [address]    Report Status 09/25/2021 FINAL  Final  Blood culture (routine x 2)     Status: None   Collection Time: 09/23/21  6:35 PM   Specimen: BLOOD  Result Value Ref Range Status    Specimen Description BLOOD RIGHT ANTECUBITAL  Final   Special Requests   Final    BOTTLES DRAWN AEROBIC AND ANAEROBIC Blood Culture adequate volume   Culture   Final    NO GROWTH 5 DAYS Performed at [facility], [address], [place], [place] [address]    Report Status 09/28/2021 FINAL  Final  Blood culture (routine x 2)     Status: None   Collection Time: 09/23/21  6:36 PM   Specimen: BLOOD  Result Value Ref Range Status   Specimen Description BLOOD LEFT ANTECUBITAL  Final   Special Requests   Final    BOTTLES DRAWN AEROBIC AND ANAEROBIC Blood Culture results may not be optimal due to an inadequate volume of blood received in culture bottles   Culture   Final    NO GROWTH 5 DAYS Performed at [facility], [address], [place], [place] [address]    Report Status 09/28/2021 FINAL  Final  Culture, blood (single) Watts Reflex to ID Panel     Status: None   Collection Time: 09/27/21  8:53 AM   Specimen: BLOOD  Result Value Ref Range Status   Specimen Description BLOOD BCAV  Final   Special Requests   Final    BOTTLES DRAWN AEROBIC AND ANAEROBIC BLOOD RIGHT HAND   Culture   Final    NO GROWTH 5 DAYS Performed at [facility], [address] [place], [place] [address]    Report Status 10/02/2021 FINAL  Final  Aerobic/Anaerobic Culture Watts Gram Stain (surgical/deep wound)     Status: None (Preliminary result)   Collection Time: 09/30/21  3:10 PM   Specimen: Abscess  Result Value Ref Range Status   Specimen Description   Final    ABSCESS RIGHT PARASPINAL ABSCESS ASPIRATION Performed at [facility], [address], [place], [place] [address]    Special Requests   Final    NONE Performed at [facility], [address] [place], [place] [address]    Gram Stain   Final    NO SQUAMOUS EPITHELIAL CELLS SEEN ABUNDANT WBC SEEN NO ORGANISMS SEEN    Culture   Final    NO GROWTH 2 DAYS Performed at [facility], [place]  [address] [place], [place] [address]    Report Status PENDING  Incomplete    Time coordinating discharge: Over 30 minutes  Richarda Osmond, MD  [facility] 10/03/2021, 10:01 AM

## 2021-10-03 DIAGNOSIS — R339 Retention of urine, unspecified: Secondary | ICD-10-CM

## 2021-10-03 MED ORDER — POLYVINYL ALCOHOL 1.4 % OP SOLN: 1.0000 [drp] | Freq: Four times a day (QID) | OPHTHALMIC | 0 refills | Status: AC | PRN

## 2021-10-03 MED ORDER — HALOPERIDOL LACTATE 5 MG/ML IJ SOLN: 0.5000 mg | INTRAMUSCULAR | Status: AC | PRN

## 2021-10-03 MED ORDER — LORAZEPAM 2 MG/ML PO CONC: 1.0000 mg | ORAL | 0 refills | Status: AC | PRN

## 2021-10-03 MED ORDER — HALOPERIDOL 0.5 MG PO TABS: 0.5000 mg | ORAL_TABLET | ORAL | Status: AC | PRN

## 2021-10-03 MED ORDER — HALOPERIDOL LACTATE 2 MG/ML PO CONC: 0.5000 mg | ORAL | 0 refills | Status: AC | PRN

## 2021-10-03 MED ORDER — ONDANSETRON 4 MG PO TBDP
4.0000 mg | ORAL_TABLET | Freq: Four times a day (QID) | ORAL | 0 refills | Status: AC | PRN
Start: 2021-10-03 — End: ?

## 2021-10-03 MED ORDER — LORAZEPAM 1 MG PO TABS
1.0000 mg | ORAL_TABLET | ORAL | 0 refills | Status: AC | PRN
Start: 2021-10-03 — End: ?

## 2021-10-03 MED ORDER — HYDROMORPHONE HCL 1 MG/ML IJ SOLN: 0.2500 mg | INTRAMUSCULAR | 0 refills | Status: AC | PRN

## 2021-10-03 MED ORDER — HYDROCODONE-ACETAMINOPHEN 5-325 MG PO TABS: 1.0000 | ORAL_TABLET | ORAL | 0 refills | Status: AC | PRN

## 2021-10-03 NOTE — Care Management Important Message (Signed)
Important Message  Patient Details  Name: Stacy Watts MRN: [id number] Date of Birth: 04/13/1931   Medicare Important Message Given:  Other (see comment)  Discharging to hospice home.  Medicare IM withheld at this time out of respect for patient and family.    [name] 10/03/2021, 12:54 PM

## 2021-10-03 NOTE — Progress Notes (Signed)
Report call To Judeth Cornfield, RN @ [facility]. No qquestions at this time. Patient awaiting transportation

## 2021-10-03 NOTE — Progress Notes (Addendum)
[facility])   Consent forms have been completed.  [facility] notified of patient D/C and transport arranged for 11:30a pickup. TOC/Stephanie and Attending Physician/Dr. Rebbeca Paul also notified of transport arrangement.    Please send signed DNR form with patient and RN call report to [phone].    Odette Fraction, MSW [facility] Liaison [phone]

## 2021-10-03 NOTE — Progress Notes (Incomplete)
°  Progress Note   Patient: Stacy Watts [id number] DOB: [birth year] DOA: 09/23/2021     10 DOS: the patient was seen and examined on 10/03/2021   Brief hospital course: OAKLEI KLUTZ is a 88 y.o. female with medical history significant for CKD stage IIIa, hypertension, dementia, OSA. She was brought to the hospital because of back pain and increasing confusion.  She was recently treated for UTI with a 3-day course of Bactrim (started Bactrim on 09/19/2021).    She was found to have severe sepsis thought to be due to UTI initially. However, was then discovered that she had abscess of right psoas muscle, in epidural lumbar space, and vertebral osteomyelitis.   Assessment and Plan:  GOC- with continued poor prognosis despite receiving full care for sepsis, family has decided to transition to comfort care at this time. Invasive and life-prolonging interventions that can be painful or anxiety provoking will be discontinued at this time.  - further recommendations by palliative.   Sepsis- criteria have resolved  Hypokalemia- Resolved on last check  Dysphagia On pureed diet with thickened liquids  L1-L2 acute osteomyelitis/discitis with right psoas abscess- s/p aspiration and cultures negative growth to date. Was on CTX IV which is discontinued today. - analgesia PRN - antipyretics PRN  Intertrigo- (present on admission) Patient has erythema under bilateral breasts, appears consistent with candidal intertrigo. -- Nystatin powder -- Monitor closely  NSVT (nonsustained ventricular tachycardia)  Pressure injury of skin - repositioning as outlined in comfort orders  Urinary retention- (present on admission) 2/2 pelvic prolapse - maintain foley cathetor  Hypernatremia   hyponatremia- resolved at last check  Acute metabolic encephalopathy- (present on admission) Persistent. Patient lethargic.  Supportive care / delirium precautions.  AKI (acute kidney injury) ([place]) [id number]-  (present on admission) - no longer monitoring  HTN (hypertension)- (present on admission) - daily vitals  Bilateral hydronephrosis 2/2 obstruction- (present on admission) - foley in place  OSA (obstructive sleep apnea)- (present on admission) CPAP ordered  Subjective: patient is not responsive to conversation. Daughters at bedside state that she had a hard night full of back pain but was then able to rest early this morning with administration of pain medications.  Physical Exam: Vitals:   10/01/21 2346 10/02/21 0741 10/02/21 1518 10/03/21 0433  BP: 128/75 112/62 119/66 124/60  Pulse: (!) 102 74 84 92  Resp: 20   16  Temp: 97.7 F (36.5 C) 97.6 F (36.4 C) 97.6 F (36.4 C) 98.9 F (37.2 C)  TempSrc: Axillary   Oral  SpO2: 93% 95% (!) 89% (!) 89%  Weight:      Height:       General exam: somnolent, no acute distress HEENT: dry mucus membranes Respiratory system: normal respiratory effort, on 2L Max Cardiovascular system: warm limbs  Skin: dry, intact, normal temperature Psychiatry: unable to evaluate due to somnolence   Data Reviewed:  WBC 21>19.4 Hgb 12.4>11.8 Abscess cultures NGTD  Family Communication: daughters at bedside  Disposition: Status is: Inpatient Remains inpatient appropriate because: severity of illness. To be discharged with hospice when bed available    Planned Discharge Destination: hospice   Time spent: >35 minutes  Author: Richarda Osmond, MD 10/03/2021 7:44 AM  For on call review [email or web address].

## 2021-10-03 NOTE — TOC Transition Note (Signed)
Transition of Care [facility]) - CM/SW Discharge Note   Patient Details  Name: Stacy Watts MRN: [id number] Date of Birth: [birth year]  Transition of Care [facility]) CM/SW Contact:  Beverly Sessions, RN Phone Number: 10/03/2021, 11:50 AM   Clinical Narrative:     Patient to discharge to hospice home today [facility] with [facility] has arranged EMS transport for 1130 EMS packet and signed DNR on chart     Barriers to Discharge: Continued Medical Work up   Patient Goals and CMS Choice        Discharge Placement                       Discharge Plan and Services   Discharge Planning Services: CM Consult                                 Social Determinants of Health (SDOH) Interventions     Readmission Risk Interventions No flowsheet data found.

## 2021-10-05 LAB — AEROBIC/ANAEROBIC CULTURE W GRAM STAIN (SURGICAL/DEEP WOUND)
Culture: NO GROWTH
Gram Stain: NONE SEEN

## 2021-10-16 DEATH — deceased

## 2022-09-06 IMAGING — CT CT GUIDANCE NEEDLE PLACEMENT
1 of 8 series · 3 of 27 positions shown, 4 images · non-contrast
Comparison: Lumbar spine MRI-09/27/2021;

INDICATION: Concern for discitis/osteomyelitis of L1-L2, with associated right
paraspinal abscess. Please perform CT-guided paraspinal abscess
aspiration for tissue diagnostic purposes.

EXAM:
CT-GUIDED RIGHT-SIDED PARASPINAL ABSCESS ASPIRATION

[Series 9: i-spiral 5.0 br38 · axial · 0.98mm/px · z∈[+448,+539]mm · 3 of 52 slices shown, 4 images]
[im 13/52  soft-tissue]
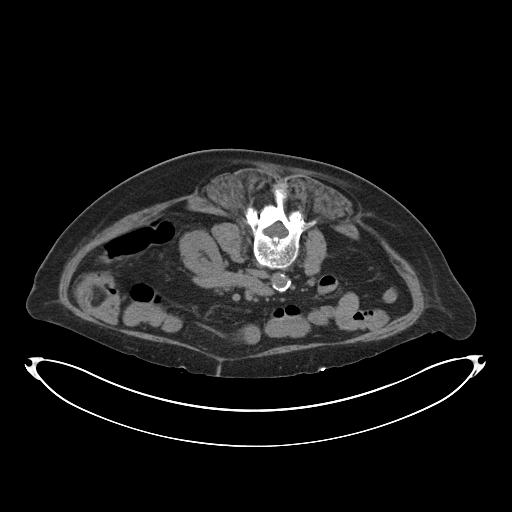
[im 13/52  bone]
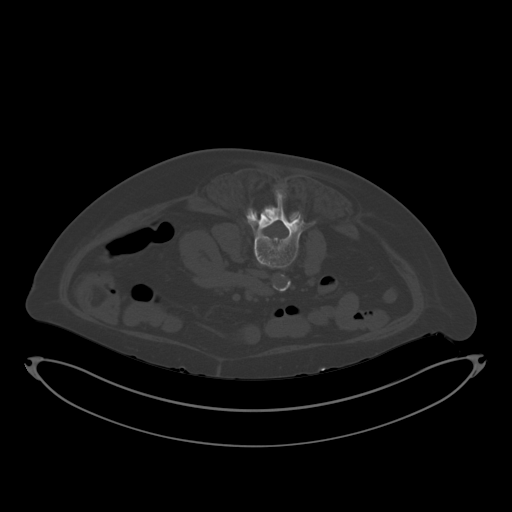
[im 26/52  bone]
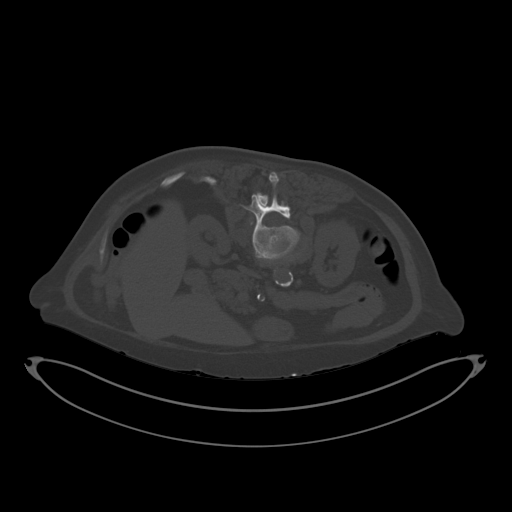
[im 39/52  bone]
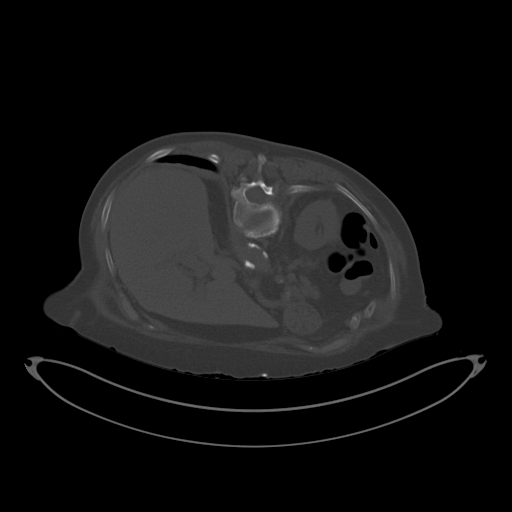

[3 of 27 positions shown; findings below may reference images not displayed]

CT abdomen
pelvis-09/23/2021

MEDICATIONS:
The patient is currently admitted to the hospital and receiving
intravenous antibiotics. The antibiotics were administered within an
appropriate time frame prior to the initiation of the procedure.

ANESTHESIA/SEDATION:
Moderate (conscious) sedation was employed during this procedure as
administered by the [REDACTED].

A total of Versed 0.5 mg and Fentanyl 25 mcg was administered
intravenously.

Moderate Sedation Time: 10 minutes. The patient's level of
consciousness and vital signs were monitored continuously by
radiology nursing throughout the procedure under my direct
supervision.

CONTRAST:  None

COMPLICATIONS:
None immediate.

PROCEDURE:
RADIATION DOSE REDUCTION: This exam was performed according to the
departmental dose-optimization program which includes automated
exposure control, adjustment of the mA and/or kV according to
patient size and/or use of iterative reconstruction technique.

Informed written consent was obtained from the patient after a
discussion of the risks, benefits and alternatives to treatment. The
patient was placed prone on the CT gantry and a pre procedural CT
was performed re-demonstrating the known abscess/fluid collection
within the right paraspinal musculature with dominant ill-defined
component measuring approximally 1.7 x 1.4 cm (image 28, series 9).
The procedure was planned. A timeout was performed prior to the
initiation of the procedure.

The skin overlying the right mid/lower back was prepped and draped
in the usual sterile fashion. The overlying soft tissues were
anesthetized with 1% lidocaine with epinephrine. Appropriate
trajectory was planned with the use of a 22 gauge spinal needle
(series 16).

Next, an 18 gauge trocar needle was advanced into the abscess/fluid
collection. Appropriate position was confirmed with CT imaging
(image 10, series 18), and approximately 2 cc of purulent appearing
fluid was aspirated as the needle was slowly withdrawn.

All aspirated fluid was capped sent to the laboratory for analysis.
A dressing was applied. The patient tolerated the procedure well
without immediate postprocedural complication.
IMPRESSION: Successful CT guided aspiration of approximately 2 cc of purulent
appearing fluid from ill-defined right-sided paraspinal abscess.
Samples were sent to the laboratory as requested by the ordering
clinical team.
